# Patient Record
Sex: Male | Born: 1935
Health system: Southern US, Community
[De-identification: ages and names within clinical notes are randomized; demographics above are authoritative.]

## PROBLEM LIST (undated history)

## (undated) DIAGNOSIS — H9190 Unspecified hearing loss, unspecified ear: Secondary | ICD-10-CM

## (undated) DIAGNOSIS — H3530 Unspecified macular degeneration: Secondary | ICD-10-CM

## (undated) DIAGNOSIS — H8109 Meniere's disease, unspecified ear: Secondary | ICD-10-CM

## (undated) DIAGNOSIS — J8410 Pulmonary fibrosis, unspecified: Secondary | ICD-10-CM

## (undated) DIAGNOSIS — J439 Emphysema, unspecified: Secondary | ICD-10-CM

## (undated) DIAGNOSIS — E039 Hypothyroidism, unspecified: Secondary | ICD-10-CM

## (undated) HISTORY — DX: Hypothyroidism, unspecified: E03.9

## (undated) HISTORY — DX: Emphysema, unspecified: J43.9

## (undated) HISTORY — DX: Unspecified macular degeneration: H35.30

## (undated) HISTORY — DX: Unspecified hearing loss, unspecified ear: H91.90

## (undated) HISTORY — DX: Pulmonary fibrosis, unspecified: J84.10

## (undated) HISTORY — PX: CATARACT EXTRACTION, BILATERAL: SHX1313

## (undated) HISTORY — PX: LAMINECTOMY: SHX219

## (undated) HISTORY — PX: TONSILLECTOMY: SUR1361

## (undated) HISTORY — DX: Meniere's disease, unspecified ear: H81.09

---
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
## 1976-12-03 HISTORY — PX: HERNIA REPAIR: SHX51

## 2003-12-04 HISTORY — PX: PROSTATECTOMY: SHX69

## 2003-12-04 HISTORY — PX: BACK SURGERY: SHX140

## 2006-12-03 DIAGNOSIS — C61 Malignant neoplasm of prostate: Secondary | ICD-10-CM

## 2006-12-03 HISTORY — DX: Malignant neoplasm of prostate: C61

## 2014-02-15 LAB — HM COLONOSCOPY

## 2014-05-17 DIAGNOSIS — R42 Dizziness and giddiness: Secondary | ICD-10-CM | POA: Insufficient documentation

## 2014-05-17 DIAGNOSIS — H8109 Meniere's disease, unspecified ear: Secondary | ICD-10-CM | POA: Insufficient documentation

## 2014-12-16 DIAGNOSIS — H3532 Exudative age-related macular degeneration: Secondary | ICD-10-CM | POA: Diagnosis not present

## 2015-02-03 DIAGNOSIS — H3532 Exudative age-related macular degeneration: Secondary | ICD-10-CM | POA: Diagnosis not present

## 2015-03-08 DIAGNOSIS — J309 Allergic rhinitis, unspecified: Secondary | ICD-10-CM | POA: Diagnosis not present

## 2015-03-08 DIAGNOSIS — R05 Cough: Secondary | ICD-10-CM | POA: Diagnosis not present

## 2015-03-08 DIAGNOSIS — J209 Acute bronchitis, unspecified: Secondary | ICD-10-CM | POA: Diagnosis not present

## 2015-03-08 DIAGNOSIS — J9811 Atelectasis: Secondary | ICD-10-CM | POA: Diagnosis not present

## 2015-03-17 DIAGNOSIS — H3532 Exudative age-related macular degeneration: Secondary | ICD-10-CM | POA: Diagnosis not present

## 2015-03-22 DIAGNOSIS — Z0000 Encounter for general adult medical examination without abnormal findings: Secondary | ICD-10-CM | POA: Diagnosis not present

## 2015-03-22 DIAGNOSIS — Z0001 Encounter for general adult medical examination with abnormal findings: Secondary | ICD-10-CM | POA: Diagnosis not present

## 2015-03-22 DIAGNOSIS — Z1211 Encounter for screening for malignant neoplasm of colon: Secondary | ICD-10-CM | POA: Diagnosis not present

## 2015-03-22 DIAGNOSIS — D485 Neoplasm of uncertain behavior of skin: Secondary | ICD-10-CM | POA: Diagnosis not present

## 2015-04-07 DIAGNOSIS — H3532 Exudative age-related macular degeneration: Secondary | ICD-10-CM | POA: Diagnosis not present

## 2015-04-12 DIAGNOSIS — K921 Melena: Secondary | ICD-10-CM | POA: Diagnosis not present

## 2015-04-14 DIAGNOSIS — L821 Other seborrheic keratosis: Secondary | ICD-10-CM | POA: Diagnosis not present

## 2015-04-14 DIAGNOSIS — D485 Neoplasm of uncertain behavior of skin: Secondary | ICD-10-CM | POA: Diagnosis not present

## 2015-04-14 DIAGNOSIS — L578 Other skin changes due to chronic exposure to nonionizing radiation: Secondary | ICD-10-CM | POA: Diagnosis not present

## 2015-04-25 DIAGNOSIS — K219 Gastro-esophageal reflux disease without esophagitis: Secondary | ICD-10-CM | POA: Diagnosis not present

## 2015-04-25 DIAGNOSIS — K2210 Ulcer of esophagus without bleeding: Secondary | ICD-10-CM | POA: Diagnosis not present

## 2015-04-25 DIAGNOSIS — K921 Melena: Secondary | ICD-10-CM | POA: Diagnosis not present

## 2015-04-25 DIAGNOSIS — J309 Allergic rhinitis, unspecified: Secondary | ICD-10-CM | POA: Diagnosis not present

## 2015-04-25 DIAGNOSIS — Z8546 Personal history of malignant neoplasm of prostate: Secondary | ICD-10-CM | POA: Diagnosis not present

## 2015-04-25 DIAGNOSIS — H8109 Meniere's disease, unspecified ear: Secondary | ICD-10-CM | POA: Diagnosis not present

## 2015-04-25 DIAGNOSIS — K209 Esophagitis, unspecified: Secondary | ICD-10-CM | POA: Diagnosis not present

## 2015-04-25 DIAGNOSIS — Z9049 Acquired absence of other specified parts of digestive tract: Secondary | ICD-10-CM | POA: Diagnosis not present

## 2015-04-25 DIAGNOSIS — K210 Gastro-esophageal reflux disease with esophagitis: Secondary | ICD-10-CM | POA: Diagnosis not present

## 2015-04-25 DIAGNOSIS — K2970 Gastritis, unspecified, without bleeding: Secondary | ICD-10-CM | POA: Diagnosis not present

## 2015-04-25 DIAGNOSIS — K449 Diaphragmatic hernia without obstruction or gangrene: Secondary | ICD-10-CM | POA: Diagnosis not present

## 2015-05-12 DIAGNOSIS — H3532 Exudative age-related macular degeneration: Secondary | ICD-10-CM | POA: Diagnosis not present

## 2015-07-07 DIAGNOSIS — H3532 Exudative age-related macular degeneration: Secondary | ICD-10-CM | POA: Diagnosis not present

## 2015-09-01 DIAGNOSIS — H3532 Exudative age-related macular degeneration: Secondary | ICD-10-CM | POA: Diagnosis not present

## 2015-10-06 DIAGNOSIS — H353221 Exudative age-related macular degeneration, left eye, with active choroidal neovascularization: Secondary | ICD-10-CM | POA: Diagnosis not present

## 2015-11-10 DIAGNOSIS — H353221 Exudative age-related macular degeneration, left eye, with active choroidal neovascularization: Secondary | ICD-10-CM | POA: Diagnosis not present

## 2015-12-15 DIAGNOSIS — H353221 Exudative age-related macular degeneration, left eye, with active choroidal neovascularization: Secondary | ICD-10-CM | POA: Diagnosis not present

## 2016-01-10 DIAGNOSIS — J09X2 Influenza due to identified novel influenza A virus with other respiratory manifestations: Secondary | ICD-10-CM | POA: Diagnosis not present

## 2016-01-18 DIAGNOSIS — H6123 Impacted cerumen, bilateral: Secondary | ICD-10-CM | POA: Diagnosis not present

## 2016-02-02 DIAGNOSIS — H353232 Exudative age-related macular degeneration, bilateral, with inactive choroidal neovascularization: Secondary | ICD-10-CM | POA: Diagnosis not present

## 2016-02-28 DIAGNOSIS — I7789 Other specified disorders of arteries and arterioles: Secondary | ICD-10-CM | POA: Diagnosis not present

## 2016-02-28 DIAGNOSIS — I38 Endocarditis, valve unspecified: Secondary | ICD-10-CM | POA: Diagnosis not present

## 2016-02-28 DIAGNOSIS — R0781 Pleurodynia: Secondary | ICD-10-CM | POA: Diagnosis not present

## 2016-02-28 DIAGNOSIS — S299XXA Unspecified injury of thorax, initial encounter: Secondary | ICD-10-CM | POA: Diagnosis not present

## 2016-02-28 DIAGNOSIS — I289 Disease of pulmonary vessels, unspecified: Secondary | ICD-10-CM | POA: Diagnosis not present

## 2016-02-28 DIAGNOSIS — Z9181 History of falling: Secondary | ICD-10-CM | POA: Diagnosis not present

## 2016-02-28 DIAGNOSIS — R0782 Intercostal pain: Secondary | ICD-10-CM | POA: Diagnosis not present

## 2016-02-28 DIAGNOSIS — J8410 Pulmonary fibrosis, unspecified: Secondary | ICD-10-CM | POA: Diagnosis not present

## 2016-04-26 DIAGNOSIS — H353232 Exudative age-related macular degeneration, bilateral, with inactive choroidal neovascularization: Secondary | ICD-10-CM | POA: Diagnosis not present

## 2016-06-07 DIAGNOSIS — H353232 Exudative age-related macular degeneration, bilateral, with inactive choroidal neovascularization: Secondary | ICD-10-CM | POA: Diagnosis not present

## 2016-08-03 HISTORY — PX: CHOLECYSTECTOMY: SHX55

## 2016-08-04 DIAGNOSIS — R109 Unspecified abdominal pain: Secondary | ICD-10-CM | POA: Diagnosis not present

## 2016-08-04 DIAGNOSIS — R918 Other nonspecific abnormal finding of lung field: Secondary | ICD-10-CM | POA: Diagnosis not present

## 2016-08-04 DIAGNOSIS — R1013 Epigastric pain: Secondary | ICD-10-CM | POA: Diagnosis not present

## 2016-08-05 DIAGNOSIS — R1013 Epigastric pain: Secondary | ICD-10-CM | POA: Diagnosis not present

## 2016-08-07 DIAGNOSIS — K8012 Calculus of gallbladder with acute and chronic cholecystitis without obstruction: Secondary | ICD-10-CM | POA: Diagnosis not present

## 2016-08-07 DIAGNOSIS — R10811 Right upper quadrant abdominal tenderness: Secondary | ICD-10-CM | POA: Diagnosis not present

## 2016-08-07 DIAGNOSIS — K810 Acute cholecystitis: Secondary | ICD-10-CM | POA: Diagnosis not present

## 2016-08-07 DIAGNOSIS — K8020 Calculus of gallbladder without cholecystitis without obstruction: Secondary | ICD-10-CM | POA: Diagnosis not present

## 2016-08-07 DIAGNOSIS — R740 Nonspecific elevation of levels of transaminase and lactic acid dehydrogenase [LDH]: Secondary | ICD-10-CM | POA: Diagnosis not present

## 2016-08-07 DIAGNOSIS — J849 Interstitial pulmonary disease, unspecified: Secondary | ICD-10-CM | POA: Diagnosis not present

## 2016-08-07 DIAGNOSIS — R1013 Epigastric pain: Secondary | ICD-10-CM | POA: Diagnosis not present

## 2016-08-07 DIAGNOSIS — I10 Essential (primary) hypertension: Secondary | ICD-10-CM | POA: Diagnosis not present

## 2016-08-07 DIAGNOSIS — K8080 Other cholelithiasis without obstruction: Secondary | ICD-10-CM | POA: Diagnosis not present

## 2016-08-07 DIAGNOSIS — K660 Peritoneal adhesions (postprocedural) (postinfection): Secondary | ICD-10-CM | POA: Diagnosis not present

## 2016-08-07 DIAGNOSIS — R110 Nausea: Secondary | ICD-10-CM | POA: Diagnosis not present

## 2016-08-07 DIAGNOSIS — K828 Other specified diseases of gallbladder: Secondary | ICD-10-CM | POA: Diagnosis not present

## 2016-08-07 DIAGNOSIS — R0602 Shortness of breath: Secondary | ICD-10-CM | POA: Diagnosis not present

## 2016-08-08 DIAGNOSIS — I10 Essential (primary) hypertension: Secondary | ICD-10-CM | POA: Diagnosis not present

## 2016-08-08 DIAGNOSIS — K8010 Calculus of gallbladder with chronic cholecystitis without obstruction: Secondary | ICD-10-CM | POA: Diagnosis not present

## 2016-08-08 DIAGNOSIS — R079 Chest pain, unspecified: Secondary | ICD-10-CM | POA: Diagnosis not present

## 2016-08-10 DIAGNOSIS — K810 Acute cholecystitis: Secondary | ICD-10-CM | POA: Diagnosis not present

## 2016-08-16 DIAGNOSIS — H9190 Unspecified hearing loss, unspecified ear: Secondary | ICD-10-CM | POA: Insufficient documentation

## 2016-08-16 DIAGNOSIS — C61 Malignant neoplasm of prostate: Secondary | ICD-10-CM | POA: Insufficient documentation

## 2016-08-17 DIAGNOSIS — Z09 Encounter for follow-up examination after completed treatment for conditions other than malignant neoplasm: Secondary | ICD-10-CM | POA: Insufficient documentation

## 2016-09-11 DIAGNOSIS — Z23 Encounter for immunization: Secondary | ICD-10-CM | POA: Diagnosis not present

## 2016-09-13 DIAGNOSIS — H353232 Exudative age-related macular degeneration, bilateral, with inactive choroidal neovascularization: Secondary | ICD-10-CM | POA: Diagnosis not present

## 2016-12-27 DIAGNOSIS — H353232 Exudative age-related macular degeneration, bilateral, with inactive choroidal neovascularization: Secondary | ICD-10-CM | POA: Diagnosis not present

## 2016-12-27 DIAGNOSIS — H5203 Hypermetropia, bilateral: Secondary | ICD-10-CM | POA: Diagnosis not present

## 2016-12-27 DIAGNOSIS — H18413 Arcus senilis, bilateral: Secondary | ICD-10-CM | POA: Diagnosis not present

## 2016-12-27 DIAGNOSIS — H43393 Other vitreous opacities, bilateral: Secondary | ICD-10-CM | POA: Diagnosis not present

## 2017-03-06 DIAGNOSIS — J410 Simple chronic bronchitis: Secondary | ICD-10-CM | POA: Diagnosis not present

## 2017-03-06 DIAGNOSIS — R0602 Shortness of breath: Secondary | ICD-10-CM | POA: Diagnosis not present

## 2017-03-06 DIAGNOSIS — H6123 Impacted cerumen, bilateral: Secondary | ICD-10-CM | POA: Diagnosis not present

## 2017-03-14 DIAGNOSIS — E669 Obesity, unspecified: Secondary | ICD-10-CM | POA: Diagnosis not present

## 2017-03-14 DIAGNOSIS — Z9989 Dependence on other enabling machines and devices: Secondary | ICD-10-CM | POA: Diagnosis not present

## 2017-03-14 DIAGNOSIS — H9113 Presbycusis, bilateral: Secondary | ICD-10-CM | POA: Diagnosis not present

## 2017-03-14 DIAGNOSIS — R0600 Dyspnea, unspecified: Secondary | ICD-10-CM | POA: Diagnosis not present

## 2017-03-14 DIAGNOSIS — M542 Cervicalgia: Secondary | ICD-10-CM | POA: Diagnosis not present

## 2017-03-14 DIAGNOSIS — K649 Unspecified hemorrhoids: Secondary | ICD-10-CM | POA: Diagnosis not present

## 2017-03-14 DIAGNOSIS — Z6832 Body mass index (BMI) 32.0-32.9, adult: Secondary | ICD-10-CM | POA: Diagnosis not present

## 2017-03-14 DIAGNOSIS — K08109 Complete loss of teeth, unspecified cause, unspecified class: Secondary | ICD-10-CM | POA: Diagnosis not present

## 2017-03-14 DIAGNOSIS — Z7951 Long term (current) use of inhaled steroids: Secondary | ICD-10-CM | POA: Diagnosis not present

## 2017-03-14 DIAGNOSIS — R2681 Unsteadiness on feet: Secondary | ICD-10-CM | POA: Diagnosis not present

## 2017-03-14 DIAGNOSIS — Z0000 Encounter for general adult medical examination without abnormal findings: Secondary | ICD-10-CM | POA: Diagnosis not present

## 2017-03-14 DIAGNOSIS — Z974 Presence of external hearing-aid: Secondary | ICD-10-CM | POA: Diagnosis not present

## 2017-03-20 DIAGNOSIS — J8410 Pulmonary fibrosis, unspecified: Secondary | ICD-10-CM | POA: Diagnosis not present

## 2017-03-20 DIAGNOSIS — H6123 Impacted cerumen, bilateral: Secondary | ICD-10-CM | POA: Diagnosis not present

## 2017-03-20 DIAGNOSIS — R0602 Shortness of breath: Secondary | ICD-10-CM | POA: Diagnosis not present

## 2017-03-20 DIAGNOSIS — J410 Simple chronic bronchitis: Secondary | ICD-10-CM | POA: Diagnosis not present

## 2017-03-27 ENCOUNTER — Telehealth: Payer: Self-pay | Admitting: Pulmonary Disease

## 2017-03-27 NOTE — Telephone Encounter (Signed)
Spoke with patient and asked that he bring his medications and medical history to his appointment tomorrow with VS at 10 am. Pt stated he does not have any medication, but will try and bring records. Pt was asked to be here at 945 to fill out paperwork. He verbalized understanding. Nothing further is needed.

## 2017-03-28 ENCOUNTER — Other Ambulatory Visit (INDEPENDENT_AMBULATORY_CARE_PROVIDER_SITE_OTHER): Payer: Medicare HMO

## 2017-03-28 ENCOUNTER — Encounter: Payer: Self-pay | Admitting: Pulmonary Disease

## 2017-03-28 ENCOUNTER — Ambulatory Visit (INDEPENDENT_AMBULATORY_CARE_PROVIDER_SITE_OTHER): Payer: Medicare HMO | Admitting: Pulmonary Disease

## 2017-03-28 VITALS — BP 112/78 | HR 63 | Ht 65.0 in | Wt 193.4 lb

## 2017-03-28 DIAGNOSIS — J8410 Pulmonary fibrosis, unspecified: Secondary | ICD-10-CM

## 2017-03-28 LAB — COMPREHENSIVE METABOLIC PANEL
ALBUMIN: 4.3 g/dL (ref 3.5–5.2)
ALK PHOS: 91 U/L (ref 39–117)
ALT: 10 U/L (ref 0–53)
AST: 15 U/L (ref 0–37)
BUN: 20 mg/dL (ref 6–23)
CALCIUM: 9.3 mg/dL (ref 8.4–10.5)
CO2: 31 mEq/L (ref 19–32)
CREATININE: 1.25 mg/dL (ref 0.40–1.50)
Chloride: 103 mEq/L (ref 96–112)
GFR: 58.23 mL/min — ABNORMAL LOW (ref >60.00)
Glucose, Bld: 90 mg/dL (ref 70–99)
POTASSIUM: 4.4 meq/L (ref 3.5–5.1)
SODIUM: 139 meq/L (ref 135–145)
TOTAL PROTEIN: 7.5 g/dL (ref 6.0–8.3)
Total Bilirubin: 0.5 mg/dL (ref 0.2–1.2)

## 2017-03-28 LAB — CBC WITH DIFFERENTIAL/PLATELET
Basophils Absolute: 0.1 10*3/uL (ref 0.0–0.1)
Basophils Relative: 1.1 % (ref 0.0–3.0)
EOS ABS: 0.2 10*3/uL (ref 0.0–0.7)
Eosinophils Relative: 2.3 % (ref 0.0–5.0)
HEMATOCRIT: 46.4 % (ref 39.0–52.0)
HEMOGLOBIN: 15.8 g/dL (ref 13.0–17.0)
LYMPHS PCT: 21.3 % (ref 12.0–46.0)
Lymphs Abs: 1.7 10*3/uL (ref 0.7–4.0)
MCHC: 34 g/dL (ref 30.0–36.0)
MCV: 89 fl (ref 78.0–100.0)
Monocytes Absolute: 0.8 10*3/uL (ref 0.1–1.0)
Monocytes Relative: 10.1 % (ref 3.0–12.0)
Neutro Abs: 5.1 10*3/uL (ref 1.4–7.7)
Neutrophils Relative %: 65.2 % (ref 43.0–77.0)
Platelets: 229 10*3/uL (ref 150.0–400.0)
RBC: 5.22 Mil/uL (ref 4.22–5.81)
RDW: 15 % (ref 11.5–15.5)
WBC: 7.8 10*3/uL (ref 4.0–10.5)

## 2017-03-28 LAB — SEDIMENTATION RATE: SED RATE: 18 mm/h (ref 0–20)

## 2017-03-28 NOTE — Progress Notes (Signed)
   Subjective:    Patient ID: Dalton Flores, male    DOB: 1929/06/15, 83 y.o.   MRN: [id number]  HPI    Review of Systems  Constitutional: Negative for fever and unexpected weight change.  HENT: Positive for sneezing. Negative for congestion, dental problem, ear pain, nosebleeds, postnasal drip, rhinorrhea, sinus pressure, sore throat and trouble swallowing.   Eyes: Negative for redness and itching.  Respiratory: Positive for shortness of breath. Negative for cough, chest tightness and wheezing.   Cardiovascular: Positive for chest pain. Negative for palpitations and leg swelling.  Gastrointestinal: Negative for nausea and vomiting.  Genitourinary: Negative for dysuria.  Musculoskeletal: Negative for joint swelling.  Skin: Negative for rash.  Neurological: Negative for headaches.  Hematological: Does not bruise/bleed easily.  Psychiatric/Behavioral: Negative for dysphoric mood. The patient is not nervous/anxious.        Objective:   Physical Exam        Assessment & Plan:

## 2017-03-28 NOTE — Patient Instructions (Signed)
Lab tests today  Will schedule pulmonary function test and overnight oxygen test  Follow up in 4 weeks with Dr. Halford Chessman or Nurse Practitioner

## 2017-03-28 NOTE — Progress Notes (Signed)
Past surgical history He  has a past surgical history that includes Cholecystectomy (08/2016); Back surgery (2005); Prostatectomy (2005); Appendectomy (1946); and Hernia repair (1978).  Family history His family history includes Brain cancer in his sister; Emphysema in his father; Heart disease in his mother.  Social history He  reports that he has never smoked. He has never used smokeless tobacco. He reports that he does not drink alcohol or use drugs.  No Known Allergies    Review of Systems  Constitutional: Negative for fever and unexpected weight change.  HENT: Positive for sneezing. Negative for congestion, dental problem, ear pain, nosebleeds, postnasal drip, rhinorrhea, sinus pressure, sore throat and trouble swallowing.   Eyes: Negative for redness and itching.  Respiratory: Positive for shortness of breath. Negative for cough, chest tightness and wheezing.   Cardiovascular: Positive for chest pain. Negative for palpitations and leg swelling.  Gastrointestinal: Negative for nausea and vomiting.  Genitourinary: Negative for dysuria.  Musculoskeletal: Negative for joint swelling.  Skin: Negative for rash.  Neurological: Negative for headaches.  Hematological: Does not bruise/bleed easily.  Psychiatric/Behavioral: Negative for dysphoric mood. The patient is not nervous/anxious.     No current outpatient prescriptions on file prior to visit.   No current facility-administered medications on file prior to visit.     Chief Complaint  Patient presents with  . PULMONARY CONSULT    Referred by Dr Dirk Dress for Pulmonary Fibrosis. Recent CXRs about 1 week ago in [place]. Pt also notes having a CT or PET scan.     Pulmonary tests CT angio chest 03/20/17 >> b/l peripheral fibrotic changes  Past medical history He  has a past medical history of Emphysema lung ([place]).  Vital signs BP 112/78 (BP Location: Left Arm, Cuff Size: Normal)   Pulse 63   Ht 5\' 5"  (1.651 m)   Wt 193  lb 6.4 oz (87.7 kg)   SpO2 95%   BMI 32.18 kg/m   History of present illness Dalton Flores is a 83 y.o. male with abnormal CT chest.  He has noticed progressive shortness of breath with exertion.  This seems to have been going on for a least the past one year.  He also gets episodes of cough, and sometimes brings up clear sputum.  He denies chest pain, fever, hemoptysis, or wheezing.  He never smoked cigarettes.  He worked on a farm, and had his own Holiday representative.  He can walk to his mailbox, but then gets short of breath on the way back.  He denies history of asthma, allergies, connective tissue disease, or thromboembolic disease.  His mother had lupus.  He denies skin rash, joint swelling, or leg swelling.  He gets occasional episodes of reflux, but not on regular basis.  He was given an inhaler (?breo).  He thinks might have helped some when he had sample, but not enough that he would want to continue inhaler.  Ambulatory oxymetry on room air day >> low SpO2 93%.   Physical exam  General - No distress Eyes - pupils reactive ENT - No sinus tenderness, no oral exudate, no LAN, no thyromegaly, TM clear, pupils equal/reactive Cardiac - s1s2 regular, no murmur, pulses symmetric Chest - faint basilar crackles b/l Back - No focal tenderness Abd - Soft, non-tender, no organomegaly, + bowel sounds Ext - No edema Neuro - Normal strength, cranial nerves intact Skin - No rashes Psych - Normal mood, and behavior   Discussion He has progressive dyspnea on exertion,  and cough.  His CT chest shows pulmonary fibrosis with an IPF pattern.  Assessment/plan  Pulmonary fibrosis. - will arrange for lab test including serology - will arrange for pulmonary function test - will arrange for overnight oxygen test - he might need esophagram to assess for reflux - further interventions to be determined based on above test results   Patient Instructions  Lab tests today  Will  schedule pulmonary function test and overnight oxygen test  Follow up in 4 weeks with Dr. Halford Chessman or Nurse Practitioner    Chesley Mires, MD [facility] Pager:  [phone] 03/28/2017, 10:35 AM

## 2017-03-29 LAB — SJOGRENS SYNDROME-B EXTRACTABLE NUCLEAR ANTIBODY: SSB (LA) (ENA) ANTIBODY, IGG: NEGATIVE

## 2017-03-29 LAB — ANTI-SCLERODERMA ANTIBODY: Scleroderma (Scl-70) (ENA) Antibody, IgG: <1

## 2017-03-29 LAB — CYCLIC CITRUL PEPTIDE ANTIBODY, IGG: Cyclic Citrullin Peptide Ab: <16 Units

## 2017-03-29 LAB — JO-1 ANTIBODY-IGG: JO-1 ANTIBODY, IGG: NEGATIVE

## 2017-03-29 LAB — RHEUMATOID FACTOR: Rhuematoid fact SerPl-aCnc: <14 IU/mL (ref <14)

## 2017-03-29 LAB — ANA W/REFLEX IF POSITIVE: ANA: NEGATIVE

## 2017-03-29 LAB — SJOGRENS SYNDROME-A EXTRACTABLE NUCLEAR ANTIBODY: SSA (Ro) (ENA) Antibody, IgG: <1

## 2017-04-01 LAB — ANCA SCREEN W REFLEX TITER

## 2017-04-02 DIAGNOSIS — J8410 Pulmonary fibrosis, unspecified: Secondary | ICD-10-CM | POA: Diagnosis not present

## 2017-04-03 ENCOUNTER — Encounter: Payer: Self-pay | Admitting: Pulmonary Disease

## 2017-04-12 ENCOUNTER — Telehealth: Payer: Self-pay | Admitting: Pulmonary Disease

## 2017-04-12 NOTE — Telephone Encounter (Signed)
CMP Latest Ref Rng & Units 03/28/2017  Glucose 70 - 99 mg/dL 90  BUN 6 - 23 mg/dL 20  Creatinine 0.40 - 1.50 mg/dL 1.25  Sodium 135 - 145 mEq/L 139  Potassium 3.5 - 5.1 mEq/L 4.4  Chloride 96 - 112 mEq/L 103  CO2 19 - 32 mEq/L 31  Calcium 8.4 - 10.5 mg/dL 9.3  Total Protein 6.0 - 8.3 g/dL 7.5  Total Bilirubin 0.2 - 1.2 mg/dL 0.5  Alkaline Phos 39 - 117 U/L 91  AST 0 - 37 U/L 15  ALT 0 - 53 U/L 10    CBC Latest Ref Rng & Units 03/28/2017  WBC 4.0 - 10.5 K/uL 7.8  Hemoglobin 13.0 - 17.0 g/dL 15.8  Hematocrit 39.0 - 52.0 % 46.4  Platelets 150.0 - 400.0 K/uL 229.0    Lab Results  Component Value Date   ESRSEDRATE 18 03/28/2017    Serology 03/28/17 >> ANCA, ANA, SCL 70, SSA/SSB, Anti Jo, Anti CCP, RF negative   Will have my nurse inform pt that lab tests were normal.

## 2017-04-12 NOTE — Telephone Encounter (Signed)
Results have been explained to patient, pt expressed understanding.  Pt was schedule for PFT at [facility], this has been rescheduled to our office d/t cost of hospital PFTs.  Nothing further needed.

## 2017-04-25 ENCOUNTER — Ambulatory Visit (INDEPENDENT_AMBULATORY_CARE_PROVIDER_SITE_OTHER): Payer: Medicare HMO | Admitting: Pulmonary Disease

## 2017-04-25 ENCOUNTER — Encounter: Payer: Self-pay | Admitting: Acute Care

## 2017-04-25 ENCOUNTER — Encounter (HOSPITAL_COMMUNITY): Payer: Medicare HMO

## 2017-04-25 ENCOUNTER — Ambulatory Visit (INDEPENDENT_AMBULATORY_CARE_PROVIDER_SITE_OTHER): Payer: Medicare HMO | Admitting: Acute Care

## 2017-04-25 VITALS — BP 112/70 | HR 72 | Ht 62.0 in | Wt 195.6 lb

## 2017-04-25 DIAGNOSIS — J84112 Idiopathic pulmonary fibrosis: Secondary | ICD-10-CM

## 2017-04-25 DIAGNOSIS — K219 Gastro-esophageal reflux disease without esophagitis: Secondary | ICD-10-CM | POA: Diagnosis not present

## 2017-04-25 DIAGNOSIS — J8410 Pulmonary fibrosis, unspecified: Secondary | ICD-10-CM | POA: Diagnosis not present

## 2017-04-25 DIAGNOSIS — G4734 Idiopathic sleep related nonobstructive alveolar hypoventilation: Secondary | ICD-10-CM

## 2017-04-25 LAB — PULMONARY FUNCTION TEST
DL/VA % pred: 94 %
DL/VA: 3.71 ml/min/mmHg/L
DLCO UNC: 14.85 ml/min/mmHg
DLCO cor % pred: 66 %
DLCO cor: 14.35 ml/min/mmHg
DLCO unc % pred: 68 %
FEF 25-75 Post: 1.73 L/sec
FEF 25-75 Pre: 1.74 L/sec
FEF2575-%Change-Post: 0 %
FEF2575-%Pred-Post: 176 %
FEF2575-%Pred-Pre: 177 %
FEV1-%CHANGE-POST: 1 %
FEV1-%PRED-PRE: 112 %
FEV1-%Pred-Post: 114 %
FEV1-POST: 1.9 L
FEV1-Pre: 1.87 L
FEV1FVC-%Change-Post: -2 %
FEV1FVC-%PRED-PRE: 114 %
FEV6-%Change-Post: 4 %
FEV6-%PRED-POST: 108 %
FEV6-%PRED-PRE: 103 %
FEV6-POST: 2.44 L
FEV6-PRE: 2.33 L
FEV6FVC-%CHANGE-POST: 0 %
FEV6FVC-%PRED-POST: 111 %
FEV6FVC-%Pred-Pre: 111 %
FVC-%CHANGE-POST: 4 %
FVC-%PRED-POST: 97 %
FVC-%Pred-Pre: 93 %
FVC-Post: 2.44 L
FVC-Pre: 2.34 L
POST FEV6/FVC RATIO: 100 %
PRE FEV6/FVC RATIO: 100 %
Post FEV1/FVC ratio: 78 %
Pre FEV1/FVC ratio: 80 %
RV % PRED: 71 %
RV: 1.68 L
TLC % PRED: 76 %
TLC: 4.2 L

## 2017-04-25 MED ORDER — PANTOPRAZOLE SODIUM 40 MG PO TBEC: 40.0000 mg | DELAYED_RELEASE_TABLET | Freq: Every day | ORAL | 1 refills | Status: DC

## 2017-04-25 MED ORDER — BUDESONIDE-FORMOTEROL FUMARATE 160-4.5 MCG/ACT IN AERO: 2.0000 | INHALATION_SPRAY | Freq: Two times a day (BID) | RESPIRATORY_TRACT | 0 refills | Status: DC

## 2017-04-25 NOTE — Progress Notes (Signed)
PFT done today. 

## 2017-04-25 NOTE — Progress Notes (Signed)
Will reassess his status at next ROV after adding symbicort and supplemental oxygen.  Will then decide whether to pursue antifibrinolytic therapy with either Esbriet or Ofev.  Dalton Mires, MD [facility] Pulmonary/Critical Care 04/25/2017, 4:50 PM Pager:  [phone] After 3pm call: [phone]

## 2017-04-25 NOTE — Patient Instructions (Addendum)
It is nice to meet you today. Your pulmonary function tests show a defect in diffusion of oxygen. This is a finding consistent with pulmonary fibrosis Think about if you are interested in taking one of the medications available for pulmonary fibrosis. We will do a therapeutic trial of Symbicort 2 puffs  Twice daily. Rinse mouth after use. Call us if the Symbicort helps your shortness of breath, so we can send in a prescription. We will order nocturnal oxygen today. Please wear oxygen at 2 L [place] with sleep.  Start Protonix 40 mg once daily at bedtime. I will give you a copy of the reflux diet. Follow up with Dr. Halford Chessman at first available ( 08/21/2017) Please contact office for sooner follow up if symptoms do not improve or worsen or seek emergency care  Schedule esophogram and return in 6 weeks to see Dr. Halford Chessman or NP.

## 2017-04-25 NOTE — Assessment & Plan Note (Addendum)
Continued dyspnea on exertion Plan Your pulmonary function tests show a defect in diffusion of oxygen. This is a finding consistent with pulmonary fibrosis Think about if you are interested in taking one of the medications available for pulmonary fibrosis. These medications have been found to slow progression of disease in some patients. We will do a therapeutic trial of Symbicort 2 puffs  Twice daily. Rinse mouth after use. Call us if the Symbicort helps your shortness of breath, so we can send in a prescription. We will order nocturnal oxygen today. Please wear oxygen at 2 L [place] with sleep.  Start Protonix 40 mg once daily at bedtime. I will give you a copy of the reflux diet. Follow up with Dr. Halford Chessman at first available ( 08/21/2017) Please contact office for sooner follow up if symptoms do not improve or worsen or seek emergency care  Schedule esophogram and return in 6 weeks to see Dr. Halford Chessman or NP.

## 2017-04-25 NOTE — Assessment & Plan Note (Signed)
Per O&O done 04/02/2017 oxygen saturations are 88% or below for at least 5 minutes Plan: Order nocturnal oxygen Wear oxygen at 2 L nasal cannula at every bedtime Follow-up in 4-6 weeks with NP for evaluation of treatment

## 2017-04-25 NOTE — Progress Notes (Signed)
History of Present Illness Dalton Flores is a 83 y.o. male with abnormal chest CT, suspicious for pulmonary fibrosis with an IPF pattern He was seen for consult by Dalton Flores.   04/25/2017 Follow up : Pt was seen by Dalton Flores 03/28/2017 as referral by PCP ( Dalton Flores) for abnormal CT angio of chest. CT was positive for bilateral peripheral fibrotic changes. Patient had noticed progressive shortness of breath with exertion over the last year. He is a never cigarette smoker. He worked on a farm, and had a witnessed Scientist, research (medical) business. His PCP gave him an inhaler, which we suspect is BREO, which she stated may have helped , but he did not feel a significant response. He does not desaturate below 93% with ambulatory oximetry. Suspicion was high for fibrosis with IPF pattern. Evaluation at initial consult included serology, Pulmicort function testing, an overnight oximetry. Plan was to consider esophagram to evaluate for reflux. Patient states he does have reflux, but is currently not on medication to treat it. Patient presents today with daughter for follow-up. Pulmonary function tests done 04/25/2017 indicate no restriction or obstruction, however diminished diffusion capacity, consistent with pulmonary fibrosis. Overnight oximetry indicates arterial oxygen desaturations below 88% for at least 5 minutes.Serology 03/28/17 >> ANCA, ANA, SCL 70, SSA/SSB, Anti Jo, Anti CCP, RF negative. All results were reviewed and discussed with the patient. The patient states he continues to have dyspnea with exertion. He is asking that we do another therapeutic trial of an ICS/ LABA to see if he gets benefit. He is requesting a twice daily inhaler. Patient denies fever, chest pain, orthopnea, or hemoptysis. There is no imaging within Epic for me to review today. Per patient's all imaging was done at [facility]. I did initiate conversation today regarding the use of some of the pulmonary fibrosis agents available  for treatment. I did explain that they come with some side effects. I have asked Dalton Flores in his daughter to think about whether they would like to initiate such a therapy .  Test Results: Ambulatory oxymetry on room air 03/28/2017 >> low SpO2 93%. CT angio chest 03/20/17 >> b/l peripheral fibrotic changes Serology 03/28/17 >> ANCA, ANA, SCL 70, SSA/SSB, Anti Jo, Anti CCP, RF negative  CBC Latest Ref Rng & Units 03/28/2017  WBC 4.0 - 10.5 K/uL 7.8  Hemoglobin 13.0 - 17.0 g/dL 15.8  Hematocrit 39.0 - 52.0 % 46.4  Platelets 150.0 - 400.0 K/uL 229.0    BMP Latest Ref Rng & Units 03/28/2017  Glucose 70 - 99 mg/dL 90  BUN 6 - 23 mg/dL 20  Creatinine 0.40 - 1.50 mg/dL 1.25  Sodium 135 - 145 mEq/L 139  Potassium 3.5 - 5.1 mEq/L 4.4  Chloride 96 - 112 mEq/L 103  CO2 19 - 32 mEq/L 31  Calcium 8.4 - 10.5 mg/dL 9.3   PFT    Component Value Date/Time   FEV1PRE 1.87 04/25/2017 0942   FEV1POST 1.90 04/25/2017 0942   FVCPRE 2.34 04/25/2017 0942   FVCPOST 2.44 04/25/2017 0942   TLC 4.20 04/25/2017 0942   DLCOUNC 14.85 04/25/2017 0942   PREFEV1FVCRT 80 04/25/2017 0942   PSTFEV1FVCRT 78 04/25/2017 0942    No results found.   Past medical hx Past Medical History:  Diagnosis Date  . Emphysema lung ([place])      Social History  Substance Use Topics  . Smoking status: Never Smoker  . Smokeless tobacco: Never Used  . Alcohol use No    Tobacco  Cessation: Never smoker  Past surgical hx, Family hx, Social hx all reviewed.  Current Outpatient Prescriptions on File Prior to Visit  Medication Sig  . ibuprofen (ADVIL,MOTRIN) 200 MG tablet Take 200 mg by mouth every 6 (six) hours as needed.  . Multiple Vitamins-Minerals (EYE VITAMINS & MINERALS PO) Take 1 tablet by mouth daily.   No current facility-administered medications on file prior to visit.      No Known Allergies  Review Of Systems:  Constitutional:   No  weight loss, night sweats,  Fevers, chills, fatigue, or   lassitude.  HEENT:   No headaches,  Difficulty swallowing,  Tooth/dental problems, or  Sore throat,                No sneezing, itching, ear ache, nasal congestion, post nasal drip,   CV:  No chest pain,  Orthopnea, PND, swelling in lower extremities, anasarca, dizziness, palpitations, syncope.   GI  No heartburn, indigestion, abdominal pain, nausea, vomiting, diarrhea, change in bowel habits, loss of appetite, bloody stools.   Resp: + shortness of breath with exertion less at rest.  No excess mucus, no productive cough,  No non-productive cough,  No coughing up of blood.  No change in color of mucus.  No wheezing.  No chest wall deformity  Skin: no rash or lesions.  GU: no dysuria, change in color of urine, no urgency or frequency.  No flank pain, no hematuria   MS:  No joint pain or swelling.  No decreased range of motion.  No back pain.  Psych:  No change in mood or affect. No depression or anxiety.  No memory loss.   Vital Signs BP 112/70 (BP Location: Left Arm, Cuff Size: Normal)   Pulse 72   Ht 5\' 2"  (1.575 m)   Wt 195 lb 9.6 oz (88.7 kg)   SpO2 98%   BMI 35.78 kg/m    Physical Exam:  General- No distress,  A&Ox3, pleasant and hard of hearing ENT: No sinus tenderness, TM clear, pale nasal mucosa, no oral exudate,no post nasal drip, no LAN Cardiac: S1, S2, regular rate and rhythm, no murmur Chest: No wheeze/ rales/ dullness; no accessory muscle use, no nasal flaring, no sternal retractions, crackles two thirds of the way up. Abd.: Soft Non-tender, obese Ext: No clubbing cyanosis, trace edema Neuro:  Deconditioned at baseline Skin: No rashes, warm and dry Psych: normal mood and behavior   Assessment/Plan  IPF (idiopathic pulmonary fibrosis) (HCC) Continued dyspnea on exertion Plan Your pulmonary function tests show a defect in diffusion of oxygen. This is a finding consistent with pulmonary fibrosis Think about if you are interested in taking one of the  medications available for pulmonary fibrosis. These medications have been found to slow progression of disease in some patients. We will do a therapeutic trial of Symbicort 2 puffs  Twice daily. Rinse mouth after use. Call us if the Symbicort helps your shortness of breath, so we can send in a prescription. We will order nocturnal oxygen today. Please wear oxygen at 2 L [place] with sleep.  Start Protonix 40 mg once daily at bedtime. I will give you a copy of the reflux diet. Follow up with Dalton Flores at first available ( 08/21/2017) Please contact office for sooner follow up if symptoms do not improve or worsen or seek emergency care  Schedule esophogram and return in 6 weeks to see Dalton Flores or NP.     Nocturnal hypoxemia Per O&O done 04/02/2017 oxygen  saturations are 88% or below for at least 5 minutes Plan: Order nocturnal oxygen Wear oxygen at 2 L nasal cannula at every bedtime Follow-up in 4-6 weeks with NP for evaluation of treatment    Magdalen Spatz, NP 04/25/2017  4:37 PM

## 2017-04-26 ENCOUNTER — Telehealth: Payer: Self-pay | Admitting: Acute Care

## 2017-04-26 DIAGNOSIS — J8410 Pulmonary fibrosis, unspecified: Secondary | ICD-10-CM | POA: Diagnosis not present

## 2017-04-26 NOTE — Telephone Encounter (Signed)
Please schedule patient to see me in July to see how he is doing since his appointment with Dr. Halford Chessman is not until September.. Thanks

## 2017-04-26 NOTE — Telephone Encounter (Signed)
Called pt and made appt with SG for 06/26/2017 at 2pm.

## 2017-04-30 ENCOUNTER — Encounter: Payer: Self-pay | Admitting: Acute Care

## 2017-04-30 DIAGNOSIS — J8410 Pulmonary fibrosis, unspecified: Secondary | ICD-10-CM | POA: Diagnosis not present

## 2017-04-30 DIAGNOSIS — R12 Heartburn: Secondary | ICD-10-CM | POA: Diagnosis not present

## 2017-04-30 DIAGNOSIS — K224 Dyskinesia of esophagus: Secondary | ICD-10-CM | POA: Diagnosis not present

## 2017-04-30 DIAGNOSIS — K219 Gastro-esophageal reflux disease without esophagitis: Secondary | ICD-10-CM | POA: Diagnosis not present

## 2017-05-06 ENCOUNTER — Telehealth: Payer: Self-pay | Admitting: Acute Care

## 2017-05-06 MED ORDER — BUDESONIDE-FORMOTEROL FUMARATE 160-4.5 MCG/ACT IN AERO: 2.0000 | INHALATION_SPRAY | Freq: Two times a day (BID) | RESPIRATORY_TRACT | 0 refills | Status: DC

## 2017-05-06 MED ORDER — BUDESONIDE-FORMOTEROL FUMARATE 160-4.5 MCG/ACT IN AERO
2.0000 | INHALATION_SPRAY | Freq: Two times a day (BID) | RESPIRATORY_TRACT | 0 refills | Status: DC
Start: 2017-05-06 — End: 2017-08-22

## 2017-05-06 MED ORDER — BUDESONIDE-FORMOTEROL FUMARATE 160-4.5 MCG/ACT IN AERO: 2.0000 | INHALATION_SPRAY | Freq: Two times a day (BID) | RESPIRATORY_TRACT | 2 refills | Status: DC

## 2017-05-06 NOTE — Telephone Encounter (Signed)
Spoke with patient's daughter. Stated that the patient is doing well on Symbicort 160 and would like a RX sent in to [facility]. Rx was sent in. Since the patient will run out Thursday, a sample has been left up front for pick. She is aware and verbalized understanding.

## 2017-05-09 ENCOUNTER — Telehealth: Payer: Self-pay | Admitting: Acute Care

## 2017-05-09 NOTE — Telephone Encounter (Signed)
Spoke with pt's daughter, Jeannene Patella. Advised her that there is a generic to Symbicort. States that she will contact his insurance company for cheaper alternatives. Nothing further was needed at this time.

## 2017-05-10 ENCOUNTER — Telehealth: Payer: Self-pay | Admitting: Pulmonary Disease

## 2017-05-10 NOTE — Telephone Encounter (Signed)
Attempted to contact pt's daughter. No answer, no option to leave a message. Will try back.

## 2017-05-13 NOTE — Telephone Encounter (Signed)
Called and spoke to the pts daughter for a few mins and then the call was disconnceted.  She stated that the symbicort is going to be $221 for a 3 month supply until he meets his deductible.  This is when the phone call was disconnected and I was not able to reach the pts daughter again.  Will need to call back.

## 2017-05-13 NOTE — Telephone Encounter (Signed)
Attempted to contact pt's daughter at work. Received a fast busy signal x2. Will try back.

## 2017-05-13 NOTE — Telephone Encounter (Signed)
Pt daughter returning call and can be reached @ (hm) [phone] will be there til 8:50 otherwise you can reach her @ [phone]).Hillery Hunter

## 2017-05-14 NOTE — Telephone Encounter (Signed)
Spoke with Pam  She is asking that VS provide a letter stating medical necessity for pt's symbicort  Med is costing over $200 for 3 month supply

## 2017-05-15 ENCOUNTER — Telehealth: Payer: Self-pay | Admitting: Pulmonary Disease

## 2017-05-15 NOTE — Telephone Encounter (Signed)
Please let him know that it is okay to stop symbicort since he is only getting mild benefit, and the risk of side effects and expense of medication likely outweigh any benefit he might get (which is minimal given that he does not have obstructive lung disease).

## 2017-05-15 NOTE — Telephone Encounter (Signed)
Please find out first if he notices any improvement in his respiratory symptoms from using symbicort.  If he doesn't feel like his breathing is better from using symbicort, then there is no need to get prescription filled.  If he does feel like symbicort is helping, then I can write a letter.  The issue will be that he had pulmonary fibrosis and not obstructive lung disease >> he was prescribed symbicort at last visit with SG for therapeutic trial.  Insurance might not approve since he doesn't have obstructive lung disease.

## 2017-05-15 NOTE — Telephone Encounter (Signed)
Spoke with Pam, the pt's daughter and notified of recs per VS  She verbalized understanding  Nothing further needed

## 2017-05-15 NOTE — Telephone Encounter (Signed)
Spoke with pt, who states he has noticed mild improvement with symbicort.   Will route to VS to make aware.

## 2017-05-15 NOTE — Telephone Encounter (Signed)
Error.Stanley A Dalton ° °

## 2017-05-21 ENCOUNTER — Telehealth: Payer: Self-pay | Admitting: Acute Care

## 2017-05-21 NOTE — Telephone Encounter (Signed)
Please call patient and let her know that his swallow evaluation was normal. Please let him know that there was notation of gastroesophageal reflux, but no indication of esophagitis. Remind him to continue his Protonix as he has been doing, and to follow the GERD diet. Thank you

## 2017-05-22 ENCOUNTER — Telehealth: Payer: Self-pay | Admitting: Pulmonary Disease

## 2017-05-22 MED ORDER — OMEPRAZOLE 40 MG PO CPDR: 40.0000 mg | DELAYED_RELEASE_CAPSULE | Freq: Every day | ORAL | 1 refills | Status: DC

## 2017-05-22 NOTE — Telephone Encounter (Signed)
Spoke with the pt's spouse and gave his dg esophagus results  She states that he has been having diarrhea since he started on protonix  She is wondering if he can be switched to omeprazole  She takes this and does not have that problem  Please advise thanks!

## 2017-05-22 NOTE — Telephone Encounter (Signed)
Please have him switch the omeprazole if that is what works for him. Please make sure they know to call us if they're having any issues with medications we prescribed. Thanks so much

## 2017-05-22 NOTE — Telephone Encounter (Signed)
Spoke with the pt's spouse and notified of recs per SG  Rx was sent to pharm and I have updated the [facility]  Nothing further needed

## 2017-05-22 NOTE — Telephone Encounter (Signed)
ATC pt, no answer. Left message for pt to call back.  

## 2017-05-27 DIAGNOSIS — J8410 Pulmonary fibrosis, unspecified: Secondary | ICD-10-CM | POA: Diagnosis not present

## 2017-05-30 NOTE — Telephone Encounter (Signed)
See 6/20 phone note

## 2017-06-26 ENCOUNTER — Ambulatory Visit: Payer: Medicare HMO | Admitting: Acute Care

## 2017-06-26 DIAGNOSIS — J8410 Pulmonary fibrosis, unspecified: Secondary | ICD-10-CM | POA: Diagnosis not present

## 2017-07-27 DIAGNOSIS — J8410 Pulmonary fibrosis, unspecified: Secondary | ICD-10-CM | POA: Diagnosis not present

## 2017-08-21 ENCOUNTER — Encounter: Payer: Self-pay | Admitting: Pulmonary Disease

## 2017-08-21 ENCOUNTER — Ambulatory Visit (INDEPENDENT_AMBULATORY_CARE_PROVIDER_SITE_OTHER): Payer: Medicare HMO | Admitting: Pulmonary Disease

## 2017-08-21 VITALS — BP 138/70 | HR 80 | Ht 65.0 in | Wt 193.0 lb

## 2017-08-21 DIAGNOSIS — J84112 Idiopathic pulmonary fibrosis: Secondary | ICD-10-CM | POA: Diagnosis not present

## 2017-08-21 DIAGNOSIS — Z23 Encounter for immunization: Secondary | ICD-10-CM

## 2017-08-21 DIAGNOSIS — M7989 Other specified soft tissue disorders: Secondary | ICD-10-CM | POA: Diagnosis not present

## 2017-08-21 DIAGNOSIS — G4734 Idiopathic sleep related nonobstructive alveolar hypoventilation: Secondary | ICD-10-CM | POA: Diagnosis not present

## 2017-08-21 NOTE — Patient Instructions (Signed)
Will have you walk today to determine if you need to use oxygen during the day  Will arrange for ultrasound of the right leg and echocardiogram - will check whether these can be done in [place]  High dose flu shot today  Follow up in 6 months

## 2017-08-21 NOTE — Progress Notes (Signed)
Current Outpatient Prescriptions on File Prior to Visit  Medication Sig  . ibuprofen (ADVIL,MOTRIN) 200 MG tablet Take 200 mg by mouth every 6 (six) hours as needed.  . Multiple Vitamins-Minerals (EYE VITAMINS & MINERALS PO) Take 1 tablet by mouth daily.  Marland Kitchen omeprazole (PRILOSEC) 40 MG capsule Take 1 capsule (40 mg total) by mouth daily before breakfast.  . budesonide-formoterol (SYMBICORT) 160-4.5 MCG/ACT inhaler Inhale 2 puffs into the lungs 2 (two) times daily. (Patient not taking: Reported on 08/21/2017)  . budesonide-formoterol (SYMBICORT) 160-4.5 MCG/ACT inhaler Inhale 2 puffs into the lungs 2 (two) times daily. (Patient not taking: Reported on 08/21/2017)   No current facility-administered medications on file prior to visit.      Chief Complaint  Patient presents with  . Follow-up    Pt breathing is not getting better over last three months. Pt is SOB at rest and with exertsion.     Pulmonary tests CT angio chest 03/20/17 >> b/l peripheral fibrotic changes Serology 03/28/17 >> ANCA, ANA, SCL 70, SSA/SSB, Anti Jo, Anti CCP, RF negative PFT 04/25/17 >> FEV1 1.90 (114%), FEV1% 78, TLC 4.20 (76%), DLCO 68%, no BD Esophagram 06/12/17 >> Mod GERD, mild/mod esophageal dysmotility  Past medical history, Past surgical history, Family history, Social history, Allergies all reviewed.  Vital Signs BP 138/70 (BP Location: Left Arm, Cuff Size: Normal)   Pulse 80   Ht 5\' 5"  (1.651 m)   Wt 193 lb (87.5 kg)   SpO2 95%   BMI 32.12 kg/m   History of Present Illness Dalton Flores is a 83 y.o. male with dyspnea and pulmonary fibrosis with IPF pattern.  He has been using his oxygen at night.  He still gets winded with activity, but can keep up with routine activities.    He is not having cough, wheeze, sputum, or chest pain.  He has soreness in Rt knee and this has been associated with Rt lower leg swelling.  He denies fever, or rash.  He tried using symbicort, but this didn't help much.   He is not using this anymore.  He maintained his oxygen level with ambulation on room air today.  Physical Exam  General - No distress ENT - No sinus tenderness, no oral exudate, no LAN Cardiac - s1s2 regular, no murmur Chest - faint basilar crackles Back - No focal tenderness Abd - Soft, non-tender Ext - 1+ ankle edema of Rt leg Neuro - Normal strength Skin - No rashes Psych - normal mood, and behavior   Assessment/Plan  ILD with IPF pattern. - continue to monitor clinically - high dose flu shot today  Nocturnal hypoxemia. - continue supplemental oxygen at night  GERD. - could be contributing to ILD - continue prilosec  Persistent dyspnea on exertion. - will get Echo to assess for LV function and pulmonary HTN  Rt lower leg edema. - will arrange for doppler of Rt leg   Patient Instructions  Will have you walk today to determine if you need to use oxygen during the day  Will arrange for ultrasound of the right leg and echocardiogram - will check whether these can be done in [place]  High dose flu shot today  Follow up in 6 months   Chesley Mires, MD [place] [facility]/Sleep Pager:  [phone] 08/21/2017, 4:52 PM

## 2017-08-22 ENCOUNTER — Telehealth: Payer: Self-pay | Admitting: Pulmonary Disease

## 2017-08-22 NOTE — Addendum Note (Signed)
Addended by: Parke Poisson E on: 08/22/2017 04:25 PM   Modules accepted: Orders

## 2017-08-22 NOTE — Telephone Encounter (Signed)
Spoke with Dalton Flores and she stated they did want these procedures done in [place].   The x-ray can be done at [facility]  See if the Echocardiogram can be done at [facility] because family does not know of any cardiology places in [place], do not schedule on the 8/26 or 10/8, requests to have both tests done at the same time. Please call daughter [phone].  PCC's can we help them with this? Thanks.

## 2017-08-22 NOTE — Telephone Encounter (Signed)
I called pt's daughter & told her we would need to get Echo precerted & then we can schedule at [facility].  I do not see an order for an x-ray.  Dtr states he was having problems with his legs.  I looked and VS put in order for Doppler Venous yesterday but I can't find order.  Told pt I would have tests precerted & then we can call and schedule.  I will call her back once done.

## 2017-08-23 NOTE — Telephone Encounter (Signed)
Scheduled pt for echo & vascular ultrasound at [facility] on 9/27 at 8:45 & 9:30.  Called pt's dtr [name] back & gave her appt info.  Faxed orders to [facility].  Nothing further needed.

## 2017-08-28 ENCOUNTER — Other Ambulatory Visit (HOSPITAL_COMMUNITY): Payer: Medicare HMO

## 2017-08-29 ENCOUNTER — Encounter: Payer: Self-pay | Admitting: Pulmonary Disease

## 2017-08-29 DIAGNOSIS — M25561 Pain in right knee: Secondary | ICD-10-CM | POA: Diagnosis not present

## 2017-08-29 DIAGNOSIS — I082 Rheumatic disorders of both aortic and tricuspid valves: Secondary | ICD-10-CM | POA: Diagnosis not present

## 2017-08-29 DIAGNOSIS — R0609 Other forms of dyspnea: Secondary | ICD-10-CM | POA: Diagnosis not present

## 2017-08-29 DIAGNOSIS — M7989 Other specified soft tissue disorders: Secondary | ICD-10-CM | POA: Diagnosis not present

## 2017-08-29 DIAGNOSIS — M79661 Pain in right lower leg: Secondary | ICD-10-CM | POA: Diagnosis not present

## 2017-08-29 DIAGNOSIS — R0602 Shortness of breath: Secondary | ICD-10-CM | POA: Diagnosis not present

## 2017-08-29 DIAGNOSIS — J84112 Idiopathic pulmonary fibrosis: Secondary | ICD-10-CM | POA: Diagnosis not present

## 2017-09-03 ENCOUNTER — Telehealth: Payer: Self-pay | Admitting: Pulmonary Disease

## 2017-09-03 NOTE — Telephone Encounter (Signed)
Doppler Rt leg 08/28/17 >> no DVT Echo 08/29/17 >> EF 55 to 60%, mod TR, enlarged RV, impaired diastolic relaxation   Please let him know his leg ultrasound did not show any clots in his leg.  His Echocardiogram showed enlargement of the right side of his heart, likely related to his known pulmonary fibrosis and low oxygen levels at night.  He should continue to wear his oxygen at night.

## 2017-09-03 NOTE — Telephone Encounter (Signed)
Left message with pt's wife for him to return call, and that I will try to call back later on this afternoon. Will follow up with another call later today for result update.

## 2017-09-03 NOTE — Telephone Encounter (Signed)
Called and spoke with pts wife and she is aware of results per VS.  Nothing further is needed.

## 2017-09-03 NOTE — Telephone Encounter (Signed)
LVM on machine for patient to return phone call regarding results. Will try to call back again at later date.

## 2017-09-03 NOTE — Telephone Encounter (Signed)
Patient's wife is returning phone call.  °

## 2017-09-26 DIAGNOSIS — J8410 Pulmonary fibrosis, unspecified: Secondary | ICD-10-CM | POA: Diagnosis not present

## 2017-10-27 DIAGNOSIS — J8410 Pulmonary fibrosis, unspecified: Secondary | ICD-10-CM | POA: Diagnosis not present

## 2017-11-26 DIAGNOSIS — J8410 Pulmonary fibrosis, unspecified: Secondary | ICD-10-CM | POA: Diagnosis not present

## 2017-12-27 DIAGNOSIS — J8410 Pulmonary fibrosis, unspecified: Secondary | ICD-10-CM | POA: Diagnosis not present

## 2018-01-27 DIAGNOSIS — J8410 Pulmonary fibrosis, unspecified: Secondary | ICD-10-CM | POA: Diagnosis not present

## 2018-02-14 DIAGNOSIS — R5383 Other fatigue: Secondary | ICD-10-CM | POA: Diagnosis not present

## 2018-02-14 DIAGNOSIS — J84112 Idiopathic pulmonary fibrosis: Secondary | ICD-10-CM | POA: Diagnosis not present

## 2018-02-14 DIAGNOSIS — R0602 Shortness of breath: Secondary | ICD-10-CM | POA: Diagnosis not present

## 2018-02-24 DIAGNOSIS — J8410 Pulmonary fibrosis, unspecified: Secondary | ICD-10-CM | POA: Diagnosis not present

## 2018-02-28 ENCOUNTER — Ambulatory Visit (INDEPENDENT_AMBULATORY_CARE_PROVIDER_SITE_OTHER)
Admission: RE | Admit: 2018-02-28 | Discharge: 2018-02-28 | Disposition: A | Discharge status: Home / Self Care | Payer: Medicare HMO | Source: Ambulatory Visit | Attending: Adult Health | Admitting: Adult Health

## 2018-02-28 ENCOUNTER — Encounter: Payer: Self-pay | Admitting: Adult Health

## 2018-02-28 ENCOUNTER — Ambulatory Visit: Payer: Medicare HMO | Admitting: Adult Health

## 2018-02-28 VITALS — BP 116/66 | HR 73 | Ht 65.0 in | Wt 189.6 lb

## 2018-02-28 DIAGNOSIS — J84112 Idiopathic pulmonary fibrosis: Secondary | ICD-10-CM | POA: Diagnosis not present

## 2018-02-28 DIAGNOSIS — R0602 Shortness of breath: Secondary | ICD-10-CM | POA: Diagnosis not present

## 2018-02-28 DIAGNOSIS — G4734 Idiopathic sleep related nonobstructive alveolar hypoventilation: Secondary | ICD-10-CM | POA: Diagnosis not present

## 2018-02-28 NOTE — Progress Notes (Signed)
Called spoke with patient's spouse.  Advised of lab results / recs as stated by TP.  Spouse verbalized understanding and denied any questions.

## 2018-02-28 NOTE — Progress Notes (Signed)
Reviewed and agree with assessment/plan.   Isak Sotomayor, MD  Pulmonary/Critical Care 11/28/2016, 12:24 PM Pager:  [phone]  

## 2018-02-28 NOTE — Progress Notes (Signed)
@Patient  ID: Dalton Flores, male    DOB: 09-May-1929, 84 y.o.   MRN: [id number]  Chief Complaint  Patient presents with  . Follow-up    Pulmonary Fibrosis     Referring provider: Rochel Brome, MD  HPI: 84 year old male never smoker followed for pulmonary fibrosis with IPF pattern on CT chest and chronic respiratory failure on terminal oxygen.  tests CT angio chest 03/20/17 >> b/l peripheral fibrotic changes Serology 03/28/17 >> ANCA, ANA, SCL 70, SSA/SSB, Anti Jo, Anti CCP, RF negative PFT 04/25/17 >> FEV1 1.90 (114%), FEV1% 78, TLC 4.20 (76%), DLCO 68%, no BD Esophagram 06/12/17 >> Mod GERD, mild/mod esophageal dysmotility Doppler Rt leg 08/28/17 >> no DVT Echo 08/29/17 >> EF 55 to 60%, mod TR, enlarged RV, impaired diastolic relaxation   5/85/2778 Follow up : Pulmonary Fibrosis , O2 RF  Patient returns for a six-month follow-up.  Patient says he is doing about the same with his breathing.  He gets winded with heavy activity.  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  He does get frustrated that he runs out of breath at times.  No decreased activity tolerance.  Walk test in the office with no desaturations on room air He is worried that his breathing will continue to decline.  He denies any flare of cough. Last visit had some lower extremity swelling right greater than left.  A venous Doppler was negative for DVT.  Echo showed preserved EF.  Enlarged right ventricle and a moderate tricuspid regurg and impaired diastolic relaxation.  Patient says his lower extremity swelling has improved.  Patient is not on a diuretic He says he was seen by his primary care physician last week and had routine lab work that was reported as normal.  Records unavailable   No Known Allergies  Immunization History  Administered Date(s) Administered  . Influenza, High Dose Seasonal PF 08/21/2017  . Influenza,inj,Quad PF,6+ Mos 09/02/2016    Past Medical History:  Diagnosis Date  . Emphysema lung  (HCC)     Tobacco History: Social History   Tobacco Use  Smoking Status Never Smoker  Smokeless Tobacco Never Used   Counseling given: Not Answered   Outpatient Encounter Medications as of 02/28/2018  Medication Sig  . Multiple Vitamins-Minerals (EYE VITAMINS & MINERALS PO) Take 1 tablet by mouth daily.  Marland Kitchen ibuprofen (ADVIL,MOTRIN) 200 MG tablet Take 200 mg by mouth every 6 (six) hours as needed.  Marland Kitchen omeprazole (PRILOSEC) 40 MG capsule Take 1 capsule (40 mg total) by mouth daily before breakfast. (Patient not taking: Reported on 02/28/2018)   No facility-administered encounter medications on file as of 02/28/2018.      Review of Systems  Constitutional:   No  weight loss, night sweats,  Fevers, chills,  +fatigue, or  lassitude.  HEENT:   No headaches,  Difficulty swallowing,  Tooth/dental problems, or  Sore throat,                No sneezing, itching, ear ache, nasal congestion, post nasal drip,   CV:  No chest pain,  Orthopnea, PND, swelling in lower extremities, anasarca, dizziness, palpitations, syncope.   GI  No heartburn, indigestion, abdominal pain, nausea, vomiting, diarrhea, change in bowel habits, loss of appetite, bloody stools.   Resp: No shortness of breath with exertion or at rest.  No excess mucus, no productive cough,  No non-productive cough,  No coughing up of blood.  No change in color of mucus.  No wheezing.  No chest wall deformity  Skin: no rash or lesions.  GU: no dysuria, change in color of urine, no urgency or frequency.  No flank pain, no hematuria   MS:  No joint pain or swelling.  No decreased range of motion.  No back pain.    Physical Exam  BP 116/66 (BP Location: Left Arm, Cuff Size: Normal)   Pulse 73   Ht 5\' 5"  (1.651 m)   Wt 189 lb 9.6 oz (86 kg)   SpO2 98%   BMI 31.55 kg/m   GEN: A/Ox3; pleasant , NAD, well nourished    HEENT:  [place]/AT,  EACs-clear, TMs-wnl, NOSE-clear, THROAT-clear, no lesions, no postnasal drip or exudate noted.    NECK:  Supple w/ fair ROM; no JVD; normal carotid impulses w/o bruits; no thyromegaly or nodules palpated; no lymphadenopathy.    RESP faint bibasilar crackles . no accessory muscle use, no dullness to percussion  CARD:  RRR, no m/r/g, no peripheral edema, pulses intact, no cyanosis or clubbing.  GI:   Soft & nt; nml bowel sounds; no organomegaly or masses detected.   Musco: Warm bil, no deformities or joint swelling noted.   Neuro: alert, no focal deficits noted.    Skin: Warm, no lesions or rashes    Lab Results:  CBC    Component Value Date/Time   WBC 7.8 03/28/2017 1105   RBC 5.22 03/28/2017 1105   HGB 15.8 03/28/2017 1105   HCT 46.4 03/28/2017 1105   PLT 229.0 03/28/2017 1105   MCV 89.0 03/28/2017 1105   MCHC 34.0 03/28/2017 1105   RDW 15.0 03/28/2017 1105   LYMPHSABS 1.7 03/28/2017 1105   MONOABS 0.8 03/28/2017 1105   EOSABS 0.2 03/28/2017 1105   BASOSABS 0.1 03/28/2017 1105    BMET    Component Value Date/Time   NA 139 03/28/2017 1105   K 4.4 03/28/2017 1105   CL 103 03/28/2017 1105   CO2 31 03/28/2017 1105   GLUCOSE 90 03/28/2017 1105   BUN 20 03/28/2017 1105   CREATININE 1.25 03/28/2017 1105   CALCIUM 9.3 03/28/2017 1105    BNP No results found for: BNP  ProBNP No results found for: PROBNP  Imaging: Dg Chest 2 View  Result Date: 02/28/2018 CLINICAL DATA:  Idiopathic pulmonary fibrosis. Chronic shortness of breath. No acute symptoms. EXAM: CHEST - 2 VIEW COMPARISON:  Radiographs and CT 03/20/2017. FINDINGS: The heart size and mediastinal contours are stable. There is prominent epicardial fat as correlated with prior CT. Slowly progressive, basilar predominant fibrotic changes are demonstrated in both lungs. There is no confluent airspace opacity, focal nodule or significant pleural effusion. Degenerative changes throughout the spine are noted. IMPRESSION: Slowly progressive changes of pulmonary fibrosis. No acute superimposed findings identified.  Electronically Signed   By: Richardean Sale M.D.   On: 02/28/2018 10:46     Assessment & Plan:   IPF (idiopathic pulmonary fibrosis) ([place]) Appears to be clinically stable.  Continues to not need oxygen with activity.  Patient is quite active for 84 years old.  Will check PFT on return Check chest x-ray today, if shows a decline may need to repeat CT chest high-resolution  Plan  Patient Instructions  Chest xray today  Continue on Oxygen 2l/m with activity  Follow up with Dr. Halford Chessman  With PFT in 2 months and As needed   Please contact office for sooner follow up if symptoms do not improve or worsen or seek emergency care  Nocturnal hypoxemia Continue on oxygen at bedtime.  No need for daytime oxygen     Rexene Edison, NP 02/28/2018

## 2018-02-28 NOTE — Assessment & Plan Note (Signed)
Continue on oxygen at bedtime.  No need for daytime oxygen

## 2018-02-28 NOTE — Patient Instructions (Signed)
Chest xray today  Continue on Oxygen 2l/m with activity  Follow up with Dr. Halford Chessman  With PFT in 2 months and As needed   Please contact office for sooner follow up if symptoms do not improve or worsen or seek emergency care

## 2018-02-28 NOTE — Assessment & Plan Note (Signed)
Appears to be clinically stable.  Continues to not need oxygen with activity.  Patient is quite active for 84 years old.  Will check PFT on return Check chest x-ray today, if shows a decline may need to repeat CT chest high-resolution  Plan  Patient Instructions  Chest xray today  Continue on Oxygen 2l/m with activity  Follow up with Dr. Halford Chessman  With PFT in 2 months and As needed   Please contact office for sooner follow up if symptoms do not improve or worsen or seek emergency care

## 2018-03-27 DIAGNOSIS — J8410 Pulmonary fibrosis, unspecified: Secondary | ICD-10-CM | POA: Diagnosis not present

## 2018-03-28 ENCOUNTER — Ambulatory Visit: Payer: Medicare HMO | Admitting: Pulmonary Disease

## 2018-04-26 DIAGNOSIS — J8410 Pulmonary fibrosis, unspecified: Secondary | ICD-10-CM | POA: Diagnosis not present

## 2018-04-30 ENCOUNTER — Ambulatory Visit: Payer: Medicare HMO | Admitting: Pulmonary Disease

## 2018-04-30 ENCOUNTER — Ambulatory Visit (INDEPENDENT_AMBULATORY_CARE_PROVIDER_SITE_OTHER): Payer: Medicare HMO | Admitting: Pulmonary Disease

## 2018-04-30 ENCOUNTER — Encounter: Payer: Self-pay | Admitting: Pulmonary Disease

## 2018-04-30 VITALS — BP 110/64 | HR 58 | Ht 65.0 in | Wt 187.0 lb

## 2018-04-30 DIAGNOSIS — G4734 Idiopathic sleep related nonobstructive alveolar hypoventilation: Secondary | ICD-10-CM

## 2018-04-30 DIAGNOSIS — J84112 Idiopathic pulmonary fibrosis: Secondary | ICD-10-CM

## 2018-04-30 DIAGNOSIS — J439 Emphysema, unspecified: Secondary | ICD-10-CM | POA: Diagnosis not present

## 2018-04-30 LAB — PULMONARY FUNCTION TEST
DL/VA % PRED: 93 %
DL/VA: 3.94 ml/min/mmHg/L
DLCO UNC: 14.81 ml/min/mmHg
DLCO unc % pred: 57 %
FEF 25-75 Post: 2.09 L/sec
FEF 25-75 Pre: 1.74 L/sec
FEF2575-%Change-Post: 19 %
FEF2575-%PRED-POST: 176 %
FEF2575-%Pred-Pre: 147 %
FEV1-%CHANGE-POST: 4 %
FEV1-%PRED-PRE: 87 %
FEV1-%Pred-Post: 91 %
FEV1-POST: 1.81 L
FEV1-PRE: 1.73 L
FEV1FVC-%CHANGE-POST: 1 %
FEV1FVC-%Pred-Pre: 114 %
FEV6-%Change-Post: 2 %
FEV6-%PRED-PRE: 81 %
FEV6-%Pred-Post: 83 %
FEV6-Post: 2.21 L
FEV6-Pre: 2.15 L
FEV6FVC-%PRED-POST: 109 %
FEV6FVC-%Pred-Pre: 109 %
FVC-%Change-Post: 2 %
FVC-%PRED-POST: 76 %
FVC-%PRED-PRE: 73 %
FVC-POST: 2.21 L
FVC-PRE: 2.15 L
PRE FEV6/FVC RATIO: 100 %
Post FEV1/FVC ratio: 82 %
Post FEV6/FVC ratio: 100 %
Pre FEV1/FVC ratio: 80 %
RV % PRED: 66 %
RV: 1.67 L
TLC % pred: 65 %
TLC: 3.99 L

## 2018-04-30 NOTE — Patient Instructions (Signed)
Call if you would like to try one of the medications for pulmonary fibrosis (Esbriet or Ofev)  Follow up in 6 months

## 2018-04-30 NOTE — Progress Notes (Signed)
[facility], [facility] Sleep [facility]  Chief Complaint  Patient presents with  . Follow-up    Breathing is unchanged since last OV. Reports SOB, chest tightness, wheezing and coughing. PFT was completed today.    Vital signs: BP 110/64 (BP Location: Left Arm, Cuff Size: Normal)   Pulse (!) 58   Ht 5\' 5"  (1.651 m)   Wt 187 lb (84.8 kg)   SpO2 98%   BMI 31.12 kg/m   History of Present Illness: Dalton Flores is a 84 y.o. male with dyspnea and pulmonary fibrosis with IPF pattern.  He gets winded with activity.  He has occasional dry cough.  Using oxygen at night.  Not having fever, chest pain, hemoptysis, skin rash, joint swelling, diarrhea, or leg swelling.  Not on any medications now.  PFT today shows progressive restriction and diffusion defect compared to 2018.  Physical Exam:  General - pleasant Eyes - pupils reactive ENT - no sinus tenderness, no oral exudate, no LAN Cardiac - regular, no murmur Chest - basilar crackles Abd - soft, non tender Ext - no edema Skin - no rashes Neuro - normal strength Psych - normal mood  Assessment/Plan:  ILD with IPF pattern. - he has progression recent CXR and PFT - discussed esbriet and ofev - he will consider these and call if he wants to try medication - if he does, then he would need repeat lab and CT chest testing  Nocturnal hypoxemia. - continue supplemental oxygen at night   Patient Instructions  Call if you would like to try one of the medications for pulmonary fibrosis (Esbriet or Ofev)  Follow up in 6 months    Chesley Mires, MD [place] 04/30/2018, 12:07 PM  Flow Sheet  Pulmonary tests: CT angio chest 03/20/17 >> b/l peripheral fibrotic changes Serology 03/28/17 >> ANCA, ANA, SCL 70, SSA/SSB, Anti Jo, Anti CCP, RF negative PFT 04/25/17 >> FEV1 1.90 (114%), FEV1% 78, TLC 4.20 (76%), DLCO 68%, no BD Esophagram 06/12/17 >> Mod GERD, mild/mod esophageal dysmotility PFT  04/30/18 >> FEV1 1.81 (91%), FEV1% 82, TLC 3.99 (65%), DLCO 57%, no BD  Cardiac tests: Doppler Rt leg 08/28/17 >> no DVT Echo 08/29/17 >> EF 55 to 60%, mod TR, enlarged RV, impaired diastolic relaxation  Past Medical History: He  has a past medical history of Emphysema lung ([place]).  Past Surgical History: He  has a past surgical history that includes Cholecystectomy (08/2016); Back surgery (2005); Prostatectomy (2005); Appendectomy (1946); and Hernia repair (1978).  Family History: His family history includes Brain cancer in his sister; Emphysema in his father; Heart disease in his mother.  Social History: He  reports that he has never smoked. He has never used smokeless tobacco. He reports that he does not drink alcohol or use drugs.  Medications: Allergies as of 04/30/2018   No Known Allergies     Medication List    as of 04/30/2018 12:07 PM   You have not been prescribed any medications.

## 2018-04-30 NOTE — Progress Notes (Signed)
PFT completed today.  

## 2018-05-27 DIAGNOSIS — J8410 Pulmonary fibrosis, unspecified: Secondary | ICD-10-CM | POA: Diagnosis not present

## 2018-06-26 DIAGNOSIS — J8410 Pulmonary fibrosis, unspecified: Secondary | ICD-10-CM | POA: Diagnosis not present

## 2018-07-22 DIAGNOSIS — H524 Presbyopia: Secondary | ICD-10-CM | POA: Diagnosis not present

## 2018-07-22 DIAGNOSIS — H353232 Exudative age-related macular degeneration, bilateral, with inactive choroidal neovascularization: Secondary | ICD-10-CM | POA: Diagnosis not present

## 2018-07-22 DIAGNOSIS — H26493 Other secondary cataract, bilateral: Secondary | ICD-10-CM | POA: Diagnosis not present

## 2018-07-27 DIAGNOSIS — J8410 Pulmonary fibrosis, unspecified: Secondary | ICD-10-CM | POA: Diagnosis not present

## 2018-08-27 DIAGNOSIS — J8410 Pulmonary fibrosis, unspecified: Secondary | ICD-10-CM | POA: Diagnosis not present

## 2018-09-17 DIAGNOSIS — Z23 Encounter for immunization: Secondary | ICD-10-CM | POA: Diagnosis not present

## 2018-09-26 DIAGNOSIS — J8410 Pulmonary fibrosis, unspecified: Secondary | ICD-10-CM | POA: Diagnosis not present

## 2018-10-24 DIAGNOSIS — H353232 Exudative age-related macular degeneration, bilateral, with inactive choroidal neovascularization: Secondary | ICD-10-CM | POA: Diagnosis not present

## 2018-10-27 DIAGNOSIS — J8410 Pulmonary fibrosis, unspecified: Secondary | ICD-10-CM | POA: Diagnosis not present

## 2018-11-26 DIAGNOSIS — J8410 Pulmonary fibrosis, unspecified: Secondary | ICD-10-CM | POA: Diagnosis not present

## 2018-12-25 DIAGNOSIS — R42 Dizziness and giddiness: Secondary | ICD-10-CM | POA: Diagnosis not present

## 2018-12-25 DIAGNOSIS — H6123 Impacted cerumen, bilateral: Secondary | ICD-10-CM | POA: Diagnosis not present

## 2018-12-25 DIAGNOSIS — H903 Sensorineural hearing loss, bilateral: Secondary | ICD-10-CM | POA: Diagnosis not present

## 2018-12-27 DIAGNOSIS — J8410 Pulmonary fibrosis, unspecified: Secondary | ICD-10-CM | POA: Diagnosis not present

## 2019-01-05 DIAGNOSIS — N5231 Erectile dysfunction following radical prostatectomy: Secondary | ICD-10-CM | POA: Diagnosis not present

## 2019-01-05 DIAGNOSIS — J9611 Chronic respiratory failure with hypoxia: Secondary | ICD-10-CM | POA: Diagnosis not present

## 2019-01-05 DIAGNOSIS — E668 Other obesity: Secondary | ICD-10-CM | POA: Diagnosis not present

## 2019-01-05 DIAGNOSIS — Z0001 Encounter for general adult medical examination with abnormal findings: Secondary | ICD-10-CM | POA: Diagnosis not present

## 2019-01-05 DIAGNOSIS — Z23 Encounter for immunization: Secondary | ICD-10-CM | POA: Diagnosis not present

## 2019-01-05 DIAGNOSIS — Z6832 Body mass index (BMI) 32.0-32.9, adult: Secondary | ICD-10-CM | POA: Diagnosis not present

## 2019-01-05 DIAGNOSIS — M25511 Pain in right shoulder: Secondary | ICD-10-CM | POA: Diagnosis not present

## 2019-01-05 DIAGNOSIS — R0602 Shortness of breath: Secondary | ICD-10-CM | POA: Diagnosis not present

## 2019-01-21 IMAGING — DX DG CHEST 2V
2 series · 2 of 2 positions shown · non-contrast
Comparison: Radiographs and CT 03/20/2017.

CLINICAL DATA: Idiopathic pulmonary fibrosis. Chronic shortness of
breath. No acute symptoms.

EXAM:
CHEST - 2 VIEW

[chest pa]
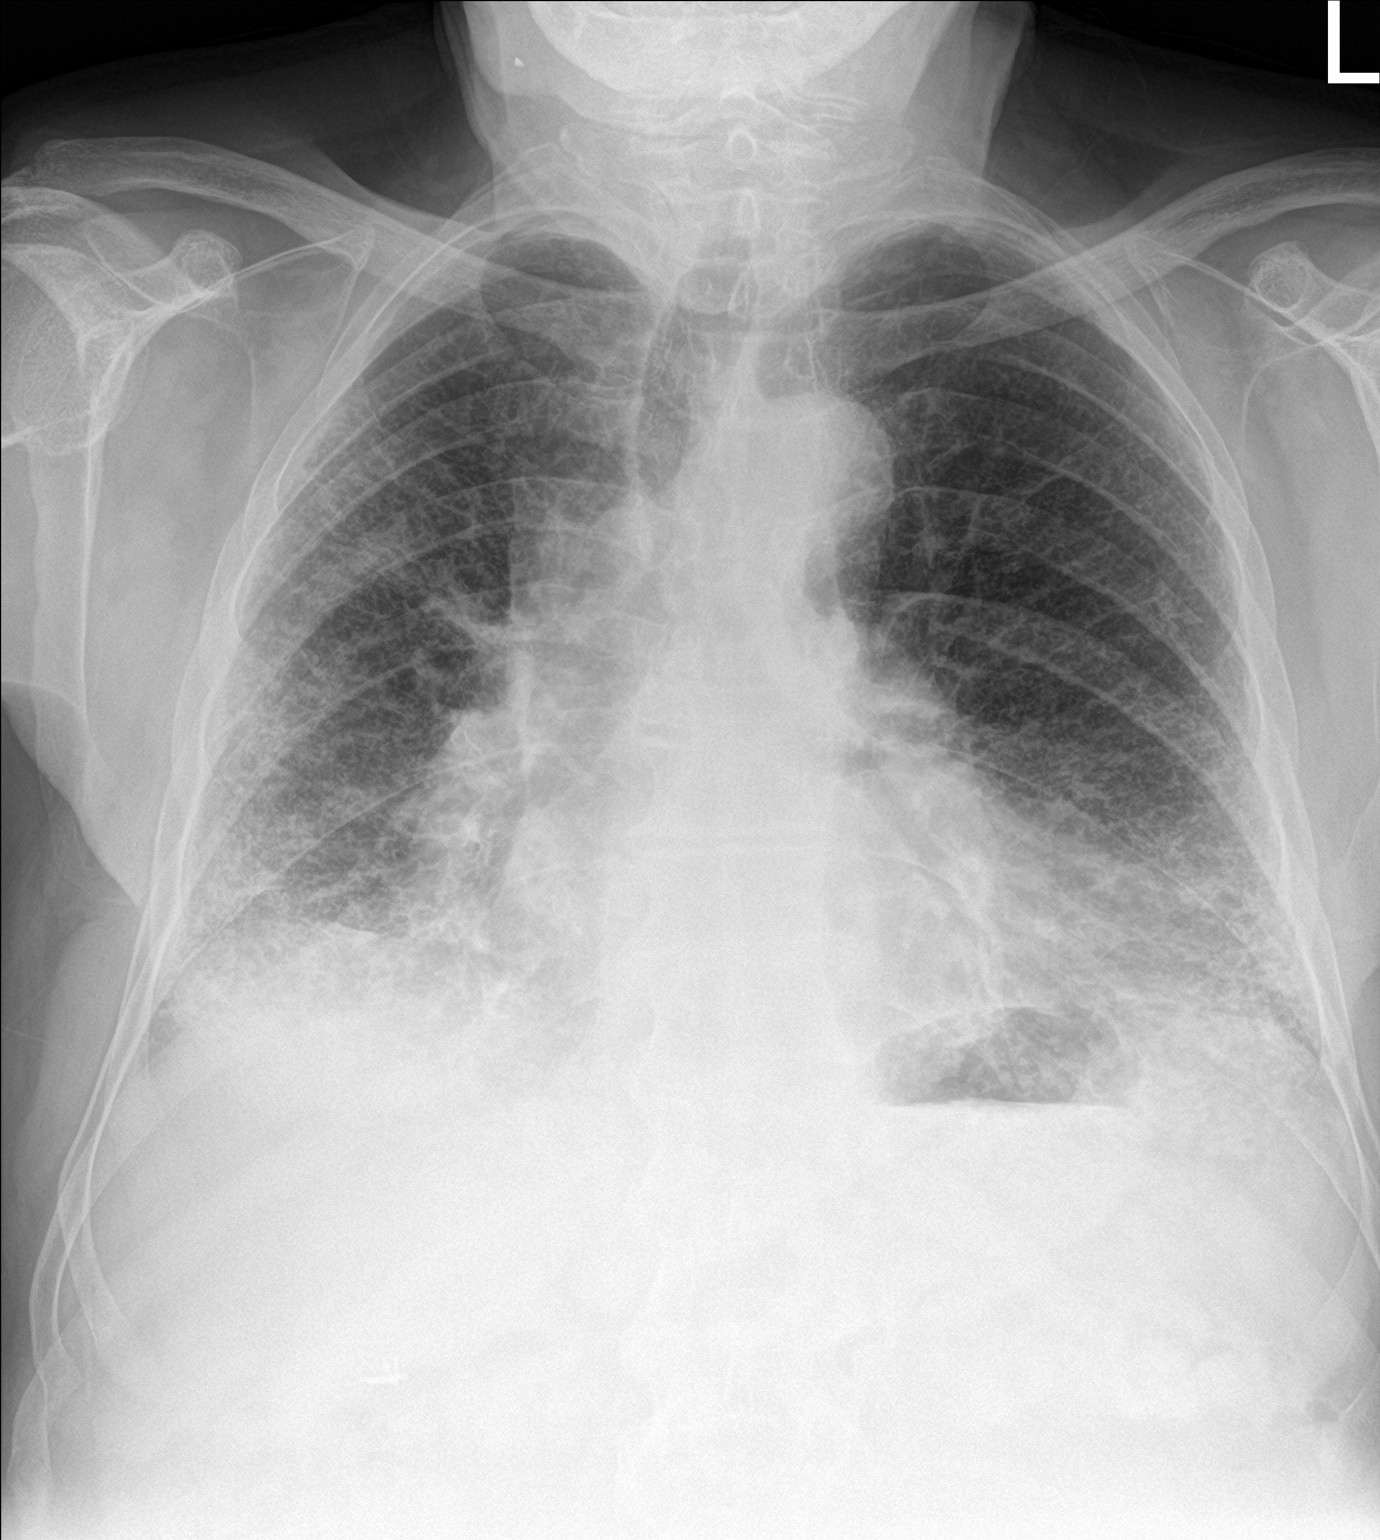

[chest lat]
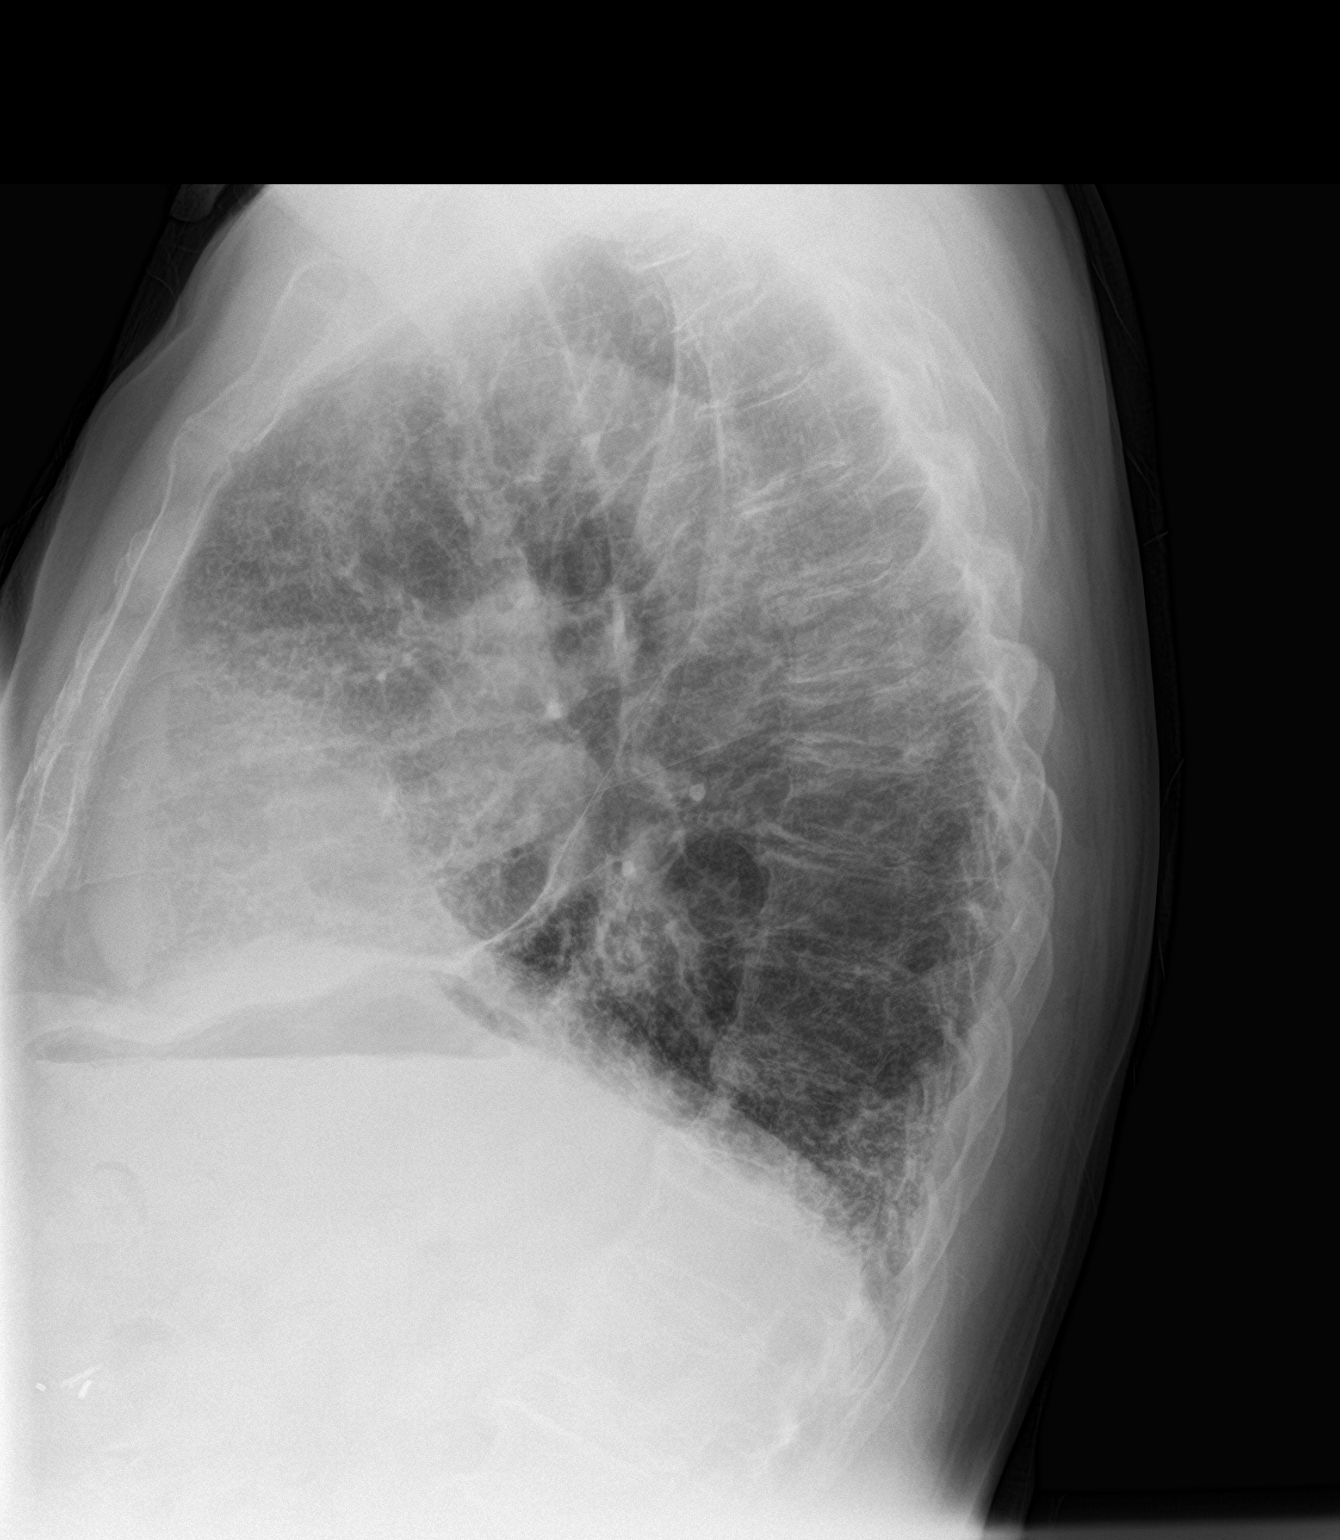

[2 of 2 positions shown; findings below may reference images not displayed]

FINDINGS: The heart size and mediastinal contours are stable. There is
prominent epicardial fat as correlated with prior CT. Slowly
progressive, basilar predominant fibrotic changes are demonstrated
in both lungs. There is no confluent airspace opacity, focal nodule
or significant pleural effusion. Degenerative changes throughout the
spine are noted.
IMPRESSION: Slowly progressive changes of pulmonary fibrosis. No acute
superimposed findings identified.

## 2019-01-27 DIAGNOSIS — J8410 Pulmonary fibrosis, unspecified: Secondary | ICD-10-CM | POA: Diagnosis not present

## 2019-02-25 DIAGNOSIS — J8410 Pulmonary fibrosis, unspecified: Secondary | ICD-10-CM | POA: Diagnosis not present

## 2019-03-28 DIAGNOSIS — J8410 Pulmonary fibrosis, unspecified: Secondary | ICD-10-CM | POA: Diagnosis not present

## 2019-04-15 DIAGNOSIS — H353113 Nonexudative age-related macular degeneration, right eye, advanced atrophic without subfoveal involvement: Secondary | ICD-10-CM | POA: Diagnosis not present

## 2019-04-27 DIAGNOSIS — J8410 Pulmonary fibrosis, unspecified: Secondary | ICD-10-CM | POA: Diagnosis not present

## 2019-05-28 DIAGNOSIS — J8410 Pulmonary fibrosis, unspecified: Secondary | ICD-10-CM | POA: Diagnosis not present

## 2019-06-27 DIAGNOSIS — J8410 Pulmonary fibrosis, unspecified: Secondary | ICD-10-CM | POA: Diagnosis not present

## 2019-07-14 DIAGNOSIS — R42 Dizziness and giddiness: Secondary | ICD-10-CM | POA: Diagnosis not present

## 2019-07-14 DIAGNOSIS — M25511 Pain in right shoulder: Secondary | ICD-10-CM | POA: Diagnosis not present

## 2019-07-14 DIAGNOSIS — M25512 Pain in left shoulder: Secondary | ICD-10-CM | POA: Diagnosis not present

## 2019-07-14 DIAGNOSIS — R350 Frequency of micturition: Secondary | ICD-10-CM | POA: Diagnosis not present

## 2019-07-14 DIAGNOSIS — F320 Major depressive disorder, single episode, mild: Secondary | ICD-10-CM | POA: Diagnosis not present

## 2019-07-16 DIAGNOSIS — Z6833 Body mass index (BMI) 33.0-33.9, adult: Secondary | ICD-10-CM | POA: Diagnosis not present

## 2019-07-16 DIAGNOSIS — R42 Dizziness and giddiness: Secondary | ICD-10-CM | POA: Diagnosis not present

## 2019-07-16 DIAGNOSIS — R350 Frequency of micturition: Secondary | ICD-10-CM | POA: Diagnosis not present

## 2019-07-16 DIAGNOSIS — M7551 Bursitis of right shoulder: Secondary | ICD-10-CM | POA: Diagnosis not present

## 2019-07-16 DIAGNOSIS — F320 Major depressive disorder, single episode, mild: Secondary | ICD-10-CM | POA: Diagnosis not present

## 2019-07-16 DIAGNOSIS — M25512 Pain in left shoulder: Secondary | ICD-10-CM | POA: Diagnosis not present

## 2019-07-17 DIAGNOSIS — R946 Abnormal results of thyroid function studies: Secondary | ICD-10-CM | POA: Diagnosis not present

## 2019-07-20 DIAGNOSIS — N3000 Acute cystitis without hematuria: Secondary | ICD-10-CM | POA: Diagnosis not present

## 2019-07-20 DIAGNOSIS — Z6832 Body mass index (BMI) 32.0-32.9, adult: Secondary | ICD-10-CM | POA: Diagnosis not present

## 2019-07-20 DIAGNOSIS — L2081 Atopic neurodermatitis: Secondary | ICD-10-CM | POA: Diagnosis not present

## 2019-07-28 DIAGNOSIS — J8410 Pulmonary fibrosis, unspecified: Secondary | ICD-10-CM | POA: Diagnosis not present

## 2019-07-30 DIAGNOSIS — Z23 Encounter for immunization: Secondary | ICD-10-CM | POA: Diagnosis not present

## 2019-07-30 DIAGNOSIS — R42 Dizziness and giddiness: Secondary | ICD-10-CM | POA: Diagnosis not present

## 2019-07-30 DIAGNOSIS — E034 Atrophy of thyroid (acquired): Secondary | ICD-10-CM | POA: Diagnosis not present

## 2019-07-30 DIAGNOSIS — J9611 Chronic respiratory failure with hypoxia: Secondary | ICD-10-CM | POA: Diagnosis not present

## 2019-07-30 DIAGNOSIS — J84112 Idiopathic pulmonary fibrosis: Secondary | ICD-10-CM | POA: Diagnosis not present

## 2019-07-30 DIAGNOSIS — R5383 Other fatigue: Secondary | ICD-10-CM | POA: Diagnosis not present

## 2019-08-17 ENCOUNTER — Ambulatory Visit: Payer: Medicare HMO | Admitting: Pulmonary Disease

## 2019-08-28 DIAGNOSIS — J8410 Pulmonary fibrosis, unspecified: Secondary | ICD-10-CM | POA: Diagnosis not present

## 2019-09-27 DIAGNOSIS — J8410 Pulmonary fibrosis, unspecified: Secondary | ICD-10-CM | POA: Diagnosis not present

## 2019-10-28 DIAGNOSIS — J8410 Pulmonary fibrosis, unspecified: Secondary | ICD-10-CM | POA: Diagnosis not present

## 2019-11-02 DIAGNOSIS — R5383 Other fatigue: Secondary | ICD-10-CM | POA: Diagnosis not present

## 2019-11-02 DIAGNOSIS — R3121 Asymptomatic microscopic hematuria: Secondary | ICD-10-CM | POA: Diagnosis not present

## 2019-11-02 DIAGNOSIS — E034 Atrophy of thyroid (acquired): Secondary | ICD-10-CM | POA: Diagnosis not present

## 2019-11-02 DIAGNOSIS — R1031 Right lower quadrant pain: Secondary | ICD-10-CM | POA: Diagnosis not present

## 2019-11-16 DIAGNOSIS — C61 Malignant neoplasm of prostate: Secondary | ICD-10-CM | POA: Diagnosis not present

## 2019-11-16 DIAGNOSIS — R339 Retention of urine, unspecified: Secondary | ICD-10-CM | POA: Diagnosis not present

## 2019-11-16 DIAGNOSIS — R300 Dysuria: Secondary | ICD-10-CM | POA: Diagnosis not present

## 2019-11-20 DIAGNOSIS — N433 Hydrocele, unspecified: Secondary | ICD-10-CM | POA: Diagnosis not present

## 2019-11-20 DIAGNOSIS — N452 Orchitis: Secondary | ICD-10-CM | POA: Diagnosis not present

## 2019-11-27 DIAGNOSIS — J8410 Pulmonary fibrosis, unspecified: Secondary | ICD-10-CM | POA: Diagnosis not present

## 2019-12-03 DIAGNOSIS — N35912 Unspecified bulbous urethral stricture, male: Secondary | ICD-10-CM | POA: Diagnosis not present

## 2019-12-10 DIAGNOSIS — Z01812 Encounter for preprocedural laboratory examination: Secondary | ICD-10-CM | POA: Diagnosis not present

## 2019-12-10 DIAGNOSIS — N35919 Unspecified urethral stricture, male, unspecified site: Secondary | ICD-10-CM | POA: Diagnosis not present

## 2019-12-10 DIAGNOSIS — Z20822 Contact with and (suspected) exposure to covid-19: Secondary | ICD-10-CM | POA: Diagnosis not present

## 2019-12-16 DIAGNOSIS — Z8546 Personal history of malignant neoplasm of prostate: Secondary | ICD-10-CM | POA: Diagnosis not present

## 2019-12-16 DIAGNOSIS — N35912 Unspecified bulbous urethral stricture, male: Secondary | ICD-10-CM | POA: Diagnosis not present

## 2019-12-16 DIAGNOSIS — N35919 Unspecified urethral stricture, male, unspecified site: Secondary | ICD-10-CM | POA: Insufficient documentation

## 2019-12-16 DIAGNOSIS — R319 Hematuria, unspecified: Secondary | ICD-10-CM | POA: Diagnosis not present

## 2019-12-16 DIAGNOSIS — N99114 Postprocedural urethral stricture, male, unspecified: Secondary | ICD-10-CM | POA: Diagnosis not present

## 2019-12-16 HISTORY — PX: BLADDER SURGERY: SHX569

## 2019-12-24 DIAGNOSIS — N35912 Unspecified bulbous urethral stricture, male: Secondary | ICD-10-CM | POA: Diagnosis not present

## 2019-12-28 DIAGNOSIS — J8410 Pulmonary fibrosis, unspecified: Secondary | ICD-10-CM | POA: Diagnosis not present

## 2020-01-04 DIAGNOSIS — H353232 Exudative age-related macular degeneration, bilateral, with inactive choroidal neovascularization: Secondary | ICD-10-CM | POA: Diagnosis not present

## 2020-01-07 DIAGNOSIS — N35912 Unspecified bulbous urethral stricture, male: Secondary | ICD-10-CM | POA: Diagnosis not present

## 2020-01-28 DIAGNOSIS — J8410 Pulmonary fibrosis, unspecified: Secondary | ICD-10-CM | POA: Diagnosis not present

## 2020-02-03 ENCOUNTER — Other Ambulatory Visit: Payer: Self-pay

## 2020-02-03 ENCOUNTER — Encounter: Payer: Self-pay | Admitting: Family Medicine

## 2020-02-03 ENCOUNTER — Ambulatory Visit (INDEPENDENT_AMBULATORY_CARE_PROVIDER_SITE_OTHER): Payer: Medicare HMO | Admitting: Family Medicine

## 2020-02-03 VITALS — BP 112/68 | HR 80 | Temp 97.7°F | Resp 18 | Ht 64.0 in | Wt 185.0 lb

## 2020-02-03 DIAGNOSIS — G4734 Idiopathic sleep related nonobstructive alveolar hypoventilation: Secondary | ICD-10-CM

## 2020-02-03 DIAGNOSIS — J84112 Idiopathic pulmonary fibrosis: Secondary | ICD-10-CM

## 2020-02-03 DIAGNOSIS — H906 Mixed conductive and sensorineural hearing loss, bilateral: Secondary | ICD-10-CM | POA: Diagnosis not present

## 2020-02-03 DIAGNOSIS — K219 Gastro-esophageal reflux disease without esophagitis: Secondary | ICD-10-CM | POA: Diagnosis not present

## 2020-02-03 NOTE — Progress Notes (Signed)
Subjective:  Patient ID: Dalton Flores, male    DOB: May 25, 1929  Age: 86 y.o. MRN: [id number]  Chief Complaint  Patient presents with  . Hypothyroidism    HPI Patient is an 86 year old white male who presents for follow-up.  Patient has idiopathic pulmonary fibrosis, chronic bronchitis, and GERD.  Patient's breathing is stable.  GERD is well controlled on omeprazole.    Social History   Socioeconomic History  . Marital status: Married    Spouse name: Not on file  . Number of children: Not on file  . Years of education: Not on file  . Highest education level: Not on file  Occupational History  . Occupation: retired  Tobacco Use  . Smoking status: Never Smoker  . Smokeless tobacco: Never Used  Substance and Sexual Activity  . Alcohol use: No  . Drug use: No  . Sexual activity: Not on file  Other Topics Concern  . Not on file  Social History Narrative  . Not on file   Social Determinants of Health   Financial Resource Strain:   . Difficulty of Paying Living Expenses:   Food Insecurity:   . Worried About Charity fundraiser in the Last Year:   . Arboriculturist in the Last Year:   Transportation Needs:   . Film/video editor (Medical):   Marland Kitchen Lack of Transportation (Non-Medical):   Physical Activity:   . Days of Exercise per Week:   . Minutes of Exercise per Session:   Stress:   . Feeling of Stress :   Social Connections:   . Frequency of Communication with Friends and Family:   . Frequency of Social Gatherings with Friends and Family:   . Attends Religious Services:   . Active Member of Clubs or Organizations:   . Attends Archivist Meetings:   Marland Kitchen Marital Status:    Past Medical History:  Diagnosis Date  . Emphysema lung ([place])   . Hearing loss   . Hypothyroid   . Macular degeneration   . Meniere disease   . Prostate cancer ([place]) 2008  . Pulmonary fibrosis (HCC)    Family History  Problem Relation Age of Onset  . Emphysema Father   .  Coronary artery disease Father   . CVA Father   . Heart disease Mother   . Brain cancer Sister   . Parkinson's disease Brother   . Heart attack Brother     Review of Systems  Constitutional: Negative for chills, fatigue and fever.  HENT: Negative for congestion, ear pain and sore throat.   Respiratory: Negative for cough and shortness of breath.   Cardiovascular: Negative for chest pain.  Gastrointestinal: Negative for abdominal pain, constipation, diarrhea, nausea and vomiting.  Endocrine: Negative for polydipsia, polyphagia and polyuria.  Genitourinary: Negative for dysuria and frequency.  Musculoskeletal: Negative for arthralgias and myalgias.  Neurological: Negative for dizziness and headaches.  Psychiatric/Behavioral: Negative for dysphoric mood.       No dysphoria     Objective:  BP 112/68   Pulse 80   Temp 97.7 F (36.5 C)   Resp 18   Ht 5\' 4"  (1.626 m)   Wt 185 lb (83.9 kg)   BMI 31.76 kg/m   BP/Weight 02/03/2020 04/30/2018 0000000  Systolic BP [id number]  Diastolic BP 68 64 66  Wt. (Lbs) 185 187 189.6  BMI 31.76 31.12 31.55    Physical Exam Vitals reviewed.  Constitutional:  Appearance: Normal appearance.  Neck:     Vascular: No carotid bruit.  Cardiovascular:     Rate and Rhythm: Normal rate and regular rhythm.     Pulses: Normal pulses.     Heart sounds: Normal heart sounds.  Pulmonary:     Effort: Pulmonary effort is normal.     Breath sounds: Normal breath sounds. No wheezing, rhonchi or rales.  Abdominal:     General: Bowel sounds are normal.     Palpations: Abdomen is soft.     Tenderness: There is no abdominal tenderness.  Neurological:     Mental Status: He is alert.  Psychiatric:        Mood and Affect: Mood normal.        Behavior: Behavior normal.     Lab Results  Component Value Date   WBC 7.8 03/28/2017   HGB 15.8 03/28/2017   HCT 46.4 03/28/2017   PLT 229.0 03/28/2017   GLUCOSE 90 03/28/2017   ALT 10 03/28/2017   AST  15 03/28/2017   NA 139 03/28/2017   K 4.4 03/28/2017   CL 103 03/28/2017   CREATININE 1.25 03/28/2017   BUN 20 03/28/2017   CO2 31 03/28/2017      Assessment & Plan:  IPF (idiopathic pulmonary fibrosis) ([place]) Reviewed history. Stable.   Nocturnal hypoxemia Continue on nighttime oxygen at 2 L. No daytime oxygen needed.  GERD without esophagitis Continue omeprazole 40 mg once daily.  Well-controlled.      Follow-up: Return in about 4 months (around 06/04/2020).  AVS was given to patient prior to departure.  Rochel Brome [facility] [phone]

## 2020-02-15 ENCOUNTER — Encounter: Payer: Self-pay | Admitting: Family Medicine

## 2020-02-15 DIAGNOSIS — K219 Gastro-esophageal reflux disease without esophagitis: Secondary | ICD-10-CM | POA: Insufficient documentation

## 2020-02-15 NOTE — Assessment & Plan Note (Signed)
Continue omeprazole 40 mg once daily.  Well-controlled.

## 2020-02-15 NOTE — Assessment & Plan Note (Signed)
Reviewed history. Stable.

## 2020-02-15 NOTE — Patient Instructions (Addendum)
IPF (idiopathic pulmonary fibrosis) ([place]) Reviewed history. Stable.   Nocturnal hypoxemia Continue on nighttime oxygen at 2 L. No daytime oxygen needed.  GERD without esophagitis Continue omeprazole 40 mg once daily.  Well-controlled.

## 2020-02-15 NOTE — Assessment & Plan Note (Signed)
Continue on nighttime oxygen at 2 L. No daytime oxygen needed.

## 2020-02-25 DIAGNOSIS — J8410 Pulmonary fibrosis, unspecified: Secondary | ICD-10-CM | POA: Diagnosis not present

## 2020-03-14 DIAGNOSIS — C61 Malignant neoplasm of prostate: Secondary | ICD-10-CM | POA: Diagnosis not present

## 2020-03-14 DIAGNOSIS — N50819 Testicular pain, unspecified: Secondary | ICD-10-CM | POA: Diagnosis not present

## 2020-03-27 DIAGNOSIS — J8410 Pulmonary fibrosis, unspecified: Secondary | ICD-10-CM | POA: Diagnosis not present

## 2020-04-26 DIAGNOSIS — J8410 Pulmonary fibrosis, unspecified: Secondary | ICD-10-CM | POA: Diagnosis not present

## 2020-05-27 DIAGNOSIS — J8410 Pulmonary fibrosis, unspecified: Secondary | ICD-10-CM | POA: Diagnosis not present

## 2020-06-07 ENCOUNTER — Ambulatory Visit: Payer: Medicare HMO | Admitting: Family Medicine

## 2020-06-26 DIAGNOSIS — J8410 Pulmonary fibrosis, unspecified: Secondary | ICD-10-CM | POA: Diagnosis not present

## 2020-07-13 DIAGNOSIS — H353133 Nonexudative age-related macular degeneration, bilateral, advanced atrophic without subfoveal involvement: Secondary | ICD-10-CM | POA: Diagnosis not present

## 2020-07-25 ENCOUNTER — Ambulatory Visit (INDEPENDENT_AMBULATORY_CARE_PROVIDER_SITE_OTHER): Payer: Medicare HMO | Admitting: Family Medicine

## 2020-07-25 ENCOUNTER — Encounter: Payer: Self-pay | Admitting: Family Medicine

## 2020-07-25 ENCOUNTER — Other Ambulatory Visit: Payer: Self-pay

## 2020-07-25 VITALS — BP 120/70 | HR 61 | Temp 97.2°F | Ht 64.0 in | Wt 185.0 lb

## 2020-07-25 DIAGNOSIS — K219 Gastro-esophageal reflux disease without esophagitis: Secondary | ICD-10-CM

## 2020-07-25 DIAGNOSIS — Z6831 Body mass index (BMI) 31.0-31.9, adult: Secondary | ICD-10-CM

## 2020-07-25 DIAGNOSIS — E6609 Other obesity due to excess calories: Secondary | ICD-10-CM

## 2020-07-25 DIAGNOSIS — J84112 Idiopathic pulmonary fibrosis: Secondary | ICD-10-CM

## 2020-07-25 DIAGNOSIS — H8103 Meniere's disease, bilateral: Secondary | ICD-10-CM

## 2020-07-25 NOTE — Progress Notes (Signed)
Subjective:  Patient ID: Dalton Flores, male    DOB: 09-May-1929  Age: 86 y.o. MRN: [id number]  Chief Complaint  Patient presents with  . Gastroesophageal Reflux  . Shortness of Breath    HPI Patient is an 86 year old white male who presents for follow-up.  Patient has idiopathic pulmonary fibrosis, chronic bronchitis, and GERD.    GERD: not on any medicines.   Idiopathic Pulmonary fibrosis: Pt has DOE, but is stable.   Dizziness: consistent with meniere's disease. Very frustrating.  Social History   Socioeconomic History  . Marital status: Married    Spouse name: Not on file  . Number of children: Not on file  . Years of education: Not on file  . Highest education level: Not on file  Occupational History  . Occupation: retired  Tobacco Use  . Smoking status: Never Smoker  . Smokeless tobacco: Never Used  Vaping Use  . Vaping Use: Never used  Substance and Sexual Activity  . Alcohol use: No  . Drug use: No  . Sexual activity: Not on file  Other Topics Concern  . Not on file  Social History Narrative  . Not on file   Social Determinants of Health   Financial Resource Strain:   . Difficulty of Paying Living Expenses: Not on file  Food Insecurity:   . Worried About Charity fundraiser in the Last Year: Not on file  . Ran Out of Food in the Last Year: Not on file  Transportation Needs:   . Lack of Transportation (Medical): Not on file  . Lack of Transportation (Non-Medical): Not on file  Physical Activity:   . Days of Exercise per Week: Not on file  . Minutes of Exercise per Session: Not on file  Stress:   . Feeling of Stress : Not on file  Social Connections:   . Frequency of Communication with Friends and Family: Not on file  . Frequency of Social Gatherings with Friends and Family: Not on file  . Attends Religious Services: Not on file  . Active Member of Clubs or Organizations: Not on file  . Attends Archivist Meetings: Not on file  .  Marital Status: Not on file   Past Medical History:  Diagnosis Date  . Emphysema lung ([place])   . Hearing loss   . Hypothyroid   . Macular degeneration   . Meniere disease   . Prostate cancer ([place]) 2008  . Pulmonary fibrosis (HCC)    Family History  Problem Relation Age of Onset  . Emphysema Father   . Coronary artery disease Father   . CVA Father   . Heart disease Mother   . Brain cancer Sister   . Parkinson's disease Brother   . Heart attack Brother     Review of Systems  Constitutional: Negative for chills, fatigue and fever.  HENT: Positive for rhinorrhea. Negative for congestion, ear pain and sore throat.   Respiratory: Positive for shortness of breath. Negative for cough.   Cardiovascular: Negative for chest pain.  Gastrointestinal: Negative for abdominal pain, constipation, diarrhea, nausea and vomiting.  Endocrine: Positive for polyuria (At night 3-4 times). Negative for polydipsia and polyphagia.  Genitourinary: Negative for dysuria and frequency.  Musculoskeletal: Negative for arthralgias and myalgias.  Neurological: Positive for dizziness. Negative for headaches.  Psychiatric/Behavioral: Negative for dysphoric mood. The patient is not nervous/anxious.        No dysphoria     Objective:  BP 120/70   Pulse  61   Temp (!) 97.2 F (36.2 C)   Ht 5\' 4"  (1.626 m)   Wt 185 lb (83.9 kg)   SpO2 100%   BMI 31.76 kg/m   BP/Weight 07/25/2020 02/03/2020 5/59/7416  Systolic BP 384 536 468  Diastolic BP 70 68 64  Wt. (Lbs) 185 185 187  BMI 31.76 31.76 31.12    Physical Exam Vitals reviewed.  Constitutional:      Appearance: Normal appearance.  Neck:     Vascular: No carotid bruit.  Cardiovascular:     Rate and Rhythm: Normal rate and regular rhythm.     Pulses: Normal pulses.     Heart sounds: Normal heart sounds.  Pulmonary:     Effort: Pulmonary effort is normal.     Breath sounds: Normal breath sounds. No wheezing, rhonchi or rales.  Abdominal:      General: Bowel sounds are normal.     Palpations: Abdomen is soft.     Tenderness: There is no abdominal tenderness.  Neurological:     Mental Status: He is alert.  Psychiatric:        Mood and Affect: Mood normal.        Behavior: Behavior normal.     Lab Results  Component Value Date   WBC 7.8 03/28/2017   HGB 15.8 03/28/2017   HCT 46.4 03/28/2017   PLT 229.0 03/28/2017   GLUCOSE 90 03/28/2017   ALT 10 03/28/2017   AST 15 03/28/2017   NA 139 03/28/2017   K 4.4 03/28/2017   CL 103 03/28/2017   CREATININE 1.25 03/28/2017   BUN 20 03/28/2017   CO2 31 03/28/2017      Assessment & Plan:  1. IPF (idiopathic pulmonary fibrosis) ([place]) No treatment at this time.   2. Meniere's disease of both ears/dizziness Caution with walking. - CBC with Differential/Platelet - Comprehensive metabolic panel - TSH  3. Class 1 obesity due to excess calories without serious comorbidity with body mass index (BMI) of 31.0 to 31.9 in adult - Lipid panel  4. GERD without esophagitis No need for meds.   Follow-up: Return in about 6 months (around 01/25/2021) for high dose flu shot in September. ..  AVS was given to patient prior to departure.  Rochel Brome [facility] [phone]

## 2020-07-27 DIAGNOSIS — J8410 Pulmonary fibrosis, unspecified: Secondary | ICD-10-CM | POA: Diagnosis not present

## 2020-08-23 ENCOUNTER — Ambulatory Visit (INDEPENDENT_AMBULATORY_CARE_PROVIDER_SITE_OTHER): Payer: Medicare Other

## 2020-08-23 ENCOUNTER — Other Ambulatory Visit: Payer: Self-pay

## 2020-08-23 DIAGNOSIS — Z23 Encounter for immunization: Secondary | ICD-10-CM

## 2020-08-23 NOTE — Progress Notes (Signed)
   Covid-19 Vaccination Clinic  Name:  DUVALL COMES    MRN: [id number] DOB: [birth year]  08/23/2020  Mr. Whitmoyer was observed post Covid-19 immunization for 15 minutes without incident. He was provided with Vaccine Information Sheet and instruction to access the V-Safe system.   Mr. Whistler was instructed to call 911 with any severe reactions post vaccine: Marland Kitchen Difficulty breathing  . Swelling of face and throat  . A fast heartbeat  . A bad rash all over body  . Dizziness and weakness

## 2020-08-27 DIAGNOSIS — J8410 Pulmonary fibrosis, unspecified: Secondary | ICD-10-CM | POA: Diagnosis not present

## 2020-08-31 ENCOUNTER — Ambulatory Visit: Payer: Medicare HMO

## 2020-09-26 DIAGNOSIS — J8410 Pulmonary fibrosis, unspecified: Secondary | ICD-10-CM | POA: Diagnosis not present

## 2020-10-04 DIAGNOSIS — N50819 Testicular pain, unspecified: Secondary | ICD-10-CM | POA: Diagnosis not present

## 2020-10-04 DIAGNOSIS — C61 Malignant neoplasm of prostate: Secondary | ICD-10-CM | POA: Diagnosis not present

## 2020-10-12 ENCOUNTER — Telehealth: Payer: Self-pay

## 2020-10-12 NOTE — Telephone Encounter (Signed)
Pt scheduled for awv

## 2020-10-18 ENCOUNTER — Ambulatory Visit (INDEPENDENT_AMBULATORY_CARE_PROVIDER_SITE_OTHER): Payer: Medicare HMO | Admitting: Family Medicine

## 2020-10-18 ENCOUNTER — Other Ambulatory Visit: Payer: Self-pay

## 2020-10-18 ENCOUNTER — Encounter: Payer: Self-pay | Admitting: Family Medicine

## 2020-10-18 VITALS — BP 120/58 | HR 64 | Temp 95.5°F | Resp 17 | Ht 65.0 in | Wt 189.0 lb

## 2020-10-18 DIAGNOSIS — Z0000 Encounter for general adult medical examination without abnormal findings: Secondary | ICD-10-CM | POA: Insufficient documentation

## 2020-10-18 DIAGNOSIS — J439 Emphysema, unspecified: Secondary | ICD-10-CM | POA: Diagnosis not present

## 2020-10-18 NOTE — Patient Instructions (Addendum)
  Dalton Flores , Thank you for taking time to come for your Medicare Wellness Visit. I appreciate your ongoing commitment to your health goals. Please review the followingplan we discussed and let me know if I can assist you in the future.    This is a list of the screening recommended for you and due dates:  Health Maintenance  Topic Date Due  . Tetanus Vaccine  Never done  . Pneumonia vaccines (1 of 2 - PCV13) Never done  . Flu Shot  Completed  . COVID-19 Vaccine  Completed   Consider getting an alert necklace Shingles vaccine at the pharmacy-call for pricing

## 2020-10-18 NOTE — Progress Notes (Signed)
Subjective:   Dalton Flores is a 86 y.o. male who presents for Medicare Annual/Subsequent preventive examination.  Review of Systems    Macular degeneration Hearing loss    colonoscopy-Dr. Lyndel Safe Objective:    Today's Vitals   10/18/20 1505  BP: (!) 120/58  Pulse: 64  Resp: 17  Temp: (!) 95.5 F (35.3 C)  SpO2: 95%  Weight: 189 lb (85.7 kg)  Height: 5\' 5"  (1.651 m)   Body mass index is 31.45 kg/m.  Advanced Directives 10/18/2020  Does Patient Have a Medical Advance Directive? No    Current Medications (verified) Outpatient Encounter Medications as of 10/18/2020  Medication Sig  . tamsulosin (FLOMAX) 0.4 MG CAPS capsule    No facility-administered encounter medications on file as of 10/18/2020.    Allergies (verified) Hydrochlorothiazide, Nitrofurantoin, and Penicillins   History: Past Medical History:  Diagnosis Date  . Emphysema lung ([place])   . Hearing loss   . Hypothyroid   . Macular degeneration   . Meniere disease   . Prostate cancer ([place]) 2008  . Pulmonary fibrosis ([place])    Past Surgical History:  Procedure Laterality Date  . APPENDECTOMY  1946  . BACK SURGERY  2005  . BLADDER SURGERY  12/16/2019    cystoscopy internal urethrotomy with balloon and holmium and bilateral retrograde pyelograms with dilitation of stricture.   Marland Kitchen CATARACT EXTRACTION, BILATERAL    . CHOLECYSTECTOMY  08/2016  . HERNIA REPAIR  1978  . LAMINECTOMY    . PROSTATECTOMY  2005  . TONSILLECTOMY     Family History  Problem Relation Age of Onset  . Emphysema Father   . Coronary artery disease Father   . CVA Father   . Heart disease Mother   . Brain cancer Sister   . Parkinson's disease Brother   . Heart attack Brother    Social History   Socioeconomic History  . Marital status: Married    Spouse name: Not on file  . Number of children: Not on file  . Years of education: Not on file  . Highest education level: Not on file  Occupational History  . Occupation:  retired  Tobacco Use  . Smoking status: Never Smoker  . Smokeless tobacco: Never Used  Vaping Use  . Vaping Use: Never used  Substance and Sexual Activity  . Alcohol use: No  . Drug use: No  . Sexual activity: Not on file  Other Topics Concern  . Not on file  Social History Narrative  . Not on file   Social Determinants of Health   Financial Resource Strain:   . Difficulty of Paying Living Expenses: Not on file  Food Insecurity:   . Worried About Charity fundraiser in the Last Year: Not on file  . Ran Out of Food in the Last Year: Not on file  Transportation Needs:   . Lack of Transportation (Medical): Not on file  . Lack of Transportation (Non-Medical): Not on file  Physical Activity:   . Days of Exercise per Week: Not on file  . Minutes of Exercise per Session: Not on file  Stress:   . Feeling of Stress : Not on file  Social Connections:   . Frequency of Communication with Friends and Family: Not on file  . Frequency of Social Gatherings with Friends and Family: Not on file  . Attends Religious Services: Not on file  . Active Member of Clubs or Organizations: Not on file  . Attends Archivist  Meetings: Not on file  . Marital Status: Not on file    Tobacco Counseling Counseling given: Not Answered    Hearing aids bilat-Meniere's Glasses-reading-macular degeneration-injections bilat No stairs inside house-uses a ramp, no stairs-uses cane and walker-vertigo Activities of Daily Living In your present state of health, do you have any difficulty performing the following activities: 10/18/2020 07/25/2020  Hearing? Y Y  Comment - wears hearing aids  Vision? Y Y  Comment - has macular degeneration  Difficulty concentrating or making decisions? N N  Walking or climbing stairs? Y Y  Comment - SOB due to pulmonary fibrosis  Dressing or bathing? N N  Doing errands, shopping? Y Y  Comment - due to site and [employer] and eating ? N -  Using the  Toilet? N -  In the past six months, have you accidently leaked urine? N -  Do you have problems with loss of bowel control? Y -  Managing your Medications? N -  Managing your Finances? N -  Housekeeping or managing your Housekeeping? N -  Some recent data might be hidden    Patient Care Team: CoxElnita Maxwell, MD as PCP - General (Family Medicine)   [facility] degeneration Pulmonary -sleeps with oxygen at night-no inhalers-pulmonary fibrosis-heats with wood Assessment:   This is a routine wellness examination for [place].  Hearing/Vision screen Macular degeneration-[facility]-2/22 Hearing loss-[place] checked hearing-no surgery Dietary issues and exercise activities discussed: Meals at home-exercise-walking   Depression Screen PHQ 2/9 Scores 10/18/2020 07/25/2020 02/15/2020  PHQ - 2 Score 2 0 0    Fall Risk Fall Risk  10/18/2020 07/25/2020 02/15/2020  Falls in the past year? 1 1 0  Number falls in past yr: 1 1 0  Injury with Fall? 0 0 0  Risk for fall due to : Impaired balance/gait History of fall(s);Impaired balance/gait -  Follow up - Falls evaluation completed;Falls prevention discussed Falls evaluation completed  fell at home 2 weeks ago-drives limited due to vision Any stairs in or around the home?none If so, are there any without handrails?yes Home free of loose throw rugs in walkways, pet beds, electrical cords, etc? yes Adequate lighting in your home to reduce risk of falls? yes  ASSISTIVE DEVICES UTILIZED TO PREVENT FALLS:  Life alert? no Use of a cane, walker or w/c?cane Grab bars in the bathroom? Bars in the bathroom Shower chair or bench in shower? no Elevated toilet seat or a handicapped toilet?no   Cognitive Function: memory loss   Immunizations Immunization History  Administered Date(s) Administered  . Fluad Quad(high Dose 65+) 08/23/2020  . Influenza, High Dose Seasonal PF 08/21/2017  . Influenza,inj,Quad PF,6+ Mos 09/02/2016  . Moderna  SARS-COVID-2 Vaccination 01/16/2020, 02/13/2020, 08/23/2020   Qualifies for Shingles Vaccine? Yes-wants shot but has not found one available-took COVID and flu Screening Tests Health Maintenance  Topic Date Due  . TETANUS/TDAP  Never done  . PNA vac Low Risk Adult (1 of 2 - PCV13) Never done  . INFLUENZA VACCINE  Completed  . COVID-19 Vaccine  Completed    Health Maintenance Pt unsure if given pneumonia 13/23 Health Maintenance Due  Topic Date Due  . TETANUS/TDAP  Never done  . PNA vac Low Risk Adult (1 of 2 - PCV13) Never done   Colon screen-Dr. Lyndel Safe   Additional Screening: Vision Screening: Recommended annual ophthalmology exams for early detection of glaucoma and other disorders of the eye. Macular degeneration Is the patient up to date with their  annual eye exam?  [place] Who is the provider or what is the name of the office in which the patient attends annual eye exams? Macular degeneration If pt is not established with a provider, would they like to be referred to a provider to establish care?   Dental Screening: upper dentures-no choking or difficulty shewing Plan:    1. Medicare annual wellness visit, subsequent Call [facility] for cost of shingles vaccine -available at the pharmacy. Call for discounts on med alert through [facility] I have personally reviewed and noted the following in the patient's chart:   . Medical and social history . Use of alcohol, tobacco or illicit drugs  . Current medications and supplements . Functional ability and status . Nutritional status . Physical activity . Advanced directives . List of other physicians . Hospitalizations, surgeries, and ER visits in previous 12 months . Vitals . Screenings to include cognitive, depression, and falls . Referrals and appointments  In addition, I have reviewed and discussed with patient certain preventive protocols, quality metrics, and best practice recommendations. A written personalized  care plan for preventive services as well as general preventive health recommendations were provided to patient.   Mertha Baars, MD   10/18/2020

## 2020-10-24 ENCOUNTER — Encounter: Payer: Self-pay | Admitting: Family Medicine

## 2020-10-24 NOTE — Progress Notes (Signed)
error 

## 2020-10-26 ENCOUNTER — Encounter: Payer: Self-pay | Admitting: Family Medicine

## 2020-10-27 DIAGNOSIS — J8410 Pulmonary fibrosis, unspecified: Secondary | ICD-10-CM | POA: Diagnosis not present

## 2020-11-14 DIAGNOSIS — C61 Malignant neoplasm of prostate: Secondary | ICD-10-CM | POA: Diagnosis not present

## 2020-11-14 DIAGNOSIS — N50819 Testicular pain, unspecified: Secondary | ICD-10-CM | POA: Diagnosis not present

## 2020-11-26 DIAGNOSIS — J8410 Pulmonary fibrosis, unspecified: Secondary | ICD-10-CM | POA: Diagnosis not present

## 2020-12-27 DIAGNOSIS — J8410 Pulmonary fibrosis, unspecified: Secondary | ICD-10-CM | POA: Diagnosis not present

## 2021-01-18 DIAGNOSIS — H353212 Exudative age-related macular degeneration, right eye, with inactive choroidal neovascularization: Secondary | ICD-10-CM | POA: Diagnosis not present

## 2021-01-27 DIAGNOSIS — J8410 Pulmonary fibrosis, unspecified: Secondary | ICD-10-CM | POA: Diagnosis not present

## 2021-01-29 NOTE — Progress Notes (Signed)
Subjective:  Patient ID: Dalton Flores, male    DOB: Jan 11, 1929  Age: 87 y.o. MRN: [id number]  Chief Complaint  Patient presents with  . pulmonary fibrosis    HPI IPF (idiopathic pulmonary fibrosis): Breathing is terrible. He sees Dr. Halford Flores. On oxygen at 2 L at night. Rarely requires during the day. Denies coughing a lot.   Meniere's disease of both ears: Spins all the time. Hearing is fair. Meclizine just makes him sleepy.   BPH: On flomax which helps some.   Current Outpatient Medications on File Prior to Visit  Medication Sig Dispense Refill  . tamsulosin (FLOMAX) 0.4 MG CAPS capsule      No current facility-administered medications on file prior to visit.   Past Medical History:  Diagnosis Date  . Emphysema lung ([place])   . Hearing loss   . Hypothyroid   . Macular degeneration   . Meniere disease   . Prostate cancer ([place]) 2008  . Pulmonary fibrosis ([place])    Past Surgical History:  Procedure Laterality Date  . APPENDECTOMY  1946  . BACK SURGERY  2005  . BLADDER SURGERY  12/16/2019    cystoscopy internal urethrotomy with balloon and holmium and bilateral retrograde pyelograms with dilitation of stricture.   Marland Kitchen CATARACT EXTRACTION, BILATERAL    . CHOLECYSTECTOMY  08/2016  . HERNIA REPAIR  1978  . LAMINECTOMY    . PROSTATECTOMY  2005  . TONSILLECTOMY      Family History  Problem Relation Age of Onset  . Emphysema Father   . Coronary artery disease Father   . CVA Father   . Heart disease Mother   . Brain cancer Sister   . Parkinson's disease Brother   . Heart attack Brother    Social History   Socioeconomic History  . Marital status: Married    Spouse name: Not on file  . Number of children: Not on file  . Years of education: Not on file  . Highest education level: Not on file  Occupational History  . Occupation: retired  Tobacco Use  . Smoking status: Never Smoker  . Smokeless tobacco: Never Used  Vaping Use  . Vaping Use: Never used  Substance  and Sexual Activity  . Alcohol use: No  . Drug use: No  . Sexual activity: Not on file  Other Topics Concern  . Not on file  Social History Narrative  . Not on file   Social Determinants of Health   Financial Resource Strain: Not on file  Food Insecurity: Not on file  Transportation Needs: Not on file  Physical Activity: Not on file  Stress: Not on file  Social Connections: Not on file    Review of Systems  Constitutional: Negative for chills, fatigue, fever and unexpected weight change.  HENT: Negative for congestion, ear pain, sinus pain and sore throat.   Respiratory: Positive for shortness of breath. Negative for cough.   Cardiovascular: Negative for chest pain and palpitations.  Gastrointestinal: Positive for diarrhea. Negative for abdominal pain, blood in stool, constipation, nausea and vomiting.  Endocrine: Negative for polydipsia.  Genitourinary: Negative for dysuria.  Musculoskeletal: Positive for arthralgias (left knee pain and swelling ). Negative for back pain.  Skin: Negative for rash.  Neurological: Positive for dizziness. Negative for headaches.  Psychiatric/Behavioral: Negative for dysphoric mood. The patient is not nervous/anxious.      Objective:  BP 118/68   Pulse 76   Temp (!) 97.5 F (36.4 C)   Resp 16  Ht 5\' 5"  (1.651 m)   Wt 191 lb (86.6 kg)   BMI 31.78 kg/m   BP/Weight 01/30/2021 10/18/2020 1/82/9937  Systolic BP 169 678 938  Diastolic BP 68 58 70  Wt. (Lbs) 191 189 185  BMI 31.78 31.45 31.76    Physical Exam Constitutional:      Appearance: Normal appearance.  Cardiovascular:     Rate and Rhythm: Normal rate and regular rhythm.     Pulses: Normal pulses.     Heart sounds: Normal heart sounds.  Pulmonary:     Effort: Pulmonary effort is normal.     Breath sounds: Normal breath sounds.  Abdominal:     General: Bowel sounds are normal.     Palpations: Abdomen is soft. There is no mass.     Tenderness: There is no abdominal  tenderness.  Neurological:     Mental Status: He is alert.  Psychiatric:        Mood and Affect: Mood normal.        Behavior: Behavior normal.        Thought Content: Thought content normal.     Diabetic Foot Exam - Simple   No data filed      Lab Results  Component Value Date   WBC 7.8 03/28/2017   HGB 15.8 03/28/2017   HCT 46.4 03/28/2017   PLT 229.0 03/28/2017   GLUCOSE 90 03/28/2017   ALT 10 03/28/2017   AST 15 03/28/2017   NA 139 03/28/2017   K 4.4 03/28/2017   CL 103 03/28/2017   CREATININE 1.25 03/28/2017   BUN 20 03/28/2017   CO2 31 03/28/2017      Assessment & Plan:   1. IPF (idiopathic pulmonary fibrosis) (HCC)  Recommend pt call Dr. Halford Flores to set up an appt to discuss treatment options.  2. Meniere's disease of both ears Stable. Very disabling. Very thorough work up.   3. Chronic respiratory failure with hypoxia.  Dependent on oxygen at night  4. Nocturnal hypoxemia Dependent on oxygen at night  5. Mixed conductive and sensorineural hearing loss of both ears  No treatment recommended.   6. BMI 31.0-31.9,adult  Recommend continue to work on eating healthy diet and exercise.  Follow-up: Return in about 6 months (around 07/30/2021) for with me. not fasting. Marland Kitchen  An After Visit Summary was printed and given to the patient.  Dalton Brome, MD [facility] [phone]

## 2021-01-30 ENCOUNTER — Other Ambulatory Visit: Payer: Self-pay

## 2021-01-30 ENCOUNTER — Encounter: Payer: Self-pay | Admitting: Family Medicine

## 2021-01-30 ENCOUNTER — Ambulatory Visit (INDEPENDENT_AMBULATORY_CARE_PROVIDER_SITE_OTHER): Payer: Medicare HMO | Admitting: Family Medicine

## 2021-01-30 VITALS — BP 118/68 | HR 76 | Temp 97.5°F | Resp 16 | Ht 65.0 in | Wt 191.0 lb

## 2021-01-30 DIAGNOSIS — G4734 Idiopathic sleep related nonobstructive alveolar hypoventilation: Secondary | ICD-10-CM

## 2021-01-30 DIAGNOSIS — H8103 Meniere's disease, bilateral: Secondary | ICD-10-CM

## 2021-01-30 DIAGNOSIS — J439 Emphysema, unspecified: Secondary | ICD-10-CM | POA: Diagnosis not present

## 2021-01-30 DIAGNOSIS — J84112 Idiopathic pulmonary fibrosis: Secondary | ICD-10-CM | POA: Diagnosis not present

## 2021-01-30 DIAGNOSIS — H906 Mixed conductive and sensorineural hearing loss, bilateral: Secondary | ICD-10-CM

## 2021-01-30 DIAGNOSIS — K219 Gastro-esophageal reflux disease without esophagitis: Secondary | ICD-10-CM | POA: Diagnosis not present

## 2021-01-30 DIAGNOSIS — Z6831 Body mass index (BMI) 31.0-31.9, adult: Secondary | ICD-10-CM | POA: Diagnosis not present

## 2021-01-30 DIAGNOSIS — J9611 Chronic respiratory failure with hypoxia: Secondary | ICD-10-CM | POA: Diagnosis not present

## 2021-01-30 NOTE — Patient Instructions (Signed)
Dr. Halford Chessman [address], [place], [place] [address] Call for follow up appointment to discuss pulmonary fibrosis medications. Phone: [phone]

## 2021-01-31 LAB — CBC WITH DIFFERENTIAL/PLATELET
Basophils Absolute: 0.1 10*3/uL (ref 0.0–0.2)
Basos: 1 %
EOS (ABSOLUTE): 0.3 10*3/uL (ref 0.0–0.4)
Eos: 3 %
Hematocrit: 45.7 % (ref 37.5–51.0)
Hemoglobin: 15.7 g/dL (ref 13.0–17.7)
Immature Grans (Abs): 0 10*3/uL (ref 0.0–0.1)
Immature Granulocytes: 0 %
Lymphocytes Absolute: 1.9 10*3/uL (ref 0.7–3.1)
Lymphs: 24 %
MCH: 29.7 pg (ref 26.6–33.0)
MCHC: 34.4 g/dL (ref 31.5–35.7)
MCV: 87 fL (ref 79–97)
Monocytes Absolute: 0.8 10*3/uL (ref 0.1–0.9)
Monocytes: 10 %
Neutrophils Absolute: 5 10*3/uL (ref 1.4–7.0)
Neutrophils: 62 %
Platelets: 240 10*3/uL (ref 150–450)
RBC: 5.28 x10E6/uL (ref 4.14–5.80)
RDW: 13.7 % (ref 11.6–15.4)
WBC: 8 10*3/uL (ref 3.4–10.8)

## 2021-01-31 LAB — LIPID PANEL
Chol/HDL Ratio: 5.6 ratio — ABNORMAL HIGH (ref 0.0–5.0)
Cholesterol, Total: 213 mg/dL — ABNORMAL HIGH (ref 100–199)
HDL: 38 mg/dL — ABNORMAL LOW (ref >39)
LDL Chol Calc (NIH): 141 mg/dL — ABNORMAL HIGH (ref 0–99)
Triglycerides: 186 mg/dL — ABNORMAL HIGH (ref 0–149)
VLDL Cholesterol Cal: 34 mg/dL (ref 5–40)

## 2021-01-31 LAB — COMPREHENSIVE METABOLIC PANEL
ALT: 15 IU/L (ref 0–44)
AST: 18 IU/L (ref 0–40)
Albumin/Globulin Ratio: 1.4 (ref 1.2–2.2)
Albumin: 4.1 g/dL (ref 3.6–4.6)
Alkaline Phosphatase: 119 IU/L (ref 44–121)
BUN/Creatinine Ratio: 14 (ref 10–24)
BUN: 17 mg/dL (ref 8–27)
Bilirubin Total: 0.4 mg/dL (ref 0.0–1.2)
CO2: 24 mmol/L (ref 20–29)
Calcium: 9.3 mg/dL (ref 8.6–10.2)
Chloride: 102 mmol/L (ref 96–106)
Creatinine, Ser: 1.18 mg/dL (ref 0.76–1.27)
Globulin, Total: 3 g/dL (ref 1.5–4.5)
Glucose: 96 mg/dL (ref 65–99)
Potassium: 5.5 mmol/L — ABNORMAL HIGH (ref 3.5–5.2)
Sodium: 140 mmol/L (ref 134–144)
Total Protein: 7.1 g/dL (ref 6.0–8.5)
eGFR: 59 mL/min/{1.73_m2} — ABNORMAL LOW (ref >59)

## 2021-01-31 LAB — TSH: TSH: 4.39 u[IU]/mL (ref 0.450–4.500)

## 2021-01-31 LAB — CARDIOVASCULAR RISK ASSESSMENT

## 2021-02-01 ENCOUNTER — Other Ambulatory Visit: Payer: Self-pay

## 2021-02-01 DIAGNOSIS — E875 Hyperkalemia: Secondary | ICD-10-CM

## 2021-02-01 MED ORDER — ATORVASTATIN CALCIUM 20 MG PO TABS: 20.0000 mg | ORAL_TABLET | Freq: Every day | ORAL | 1 refills | Status: DC

## 2021-02-03 ENCOUNTER — Ambulatory Visit: Payer: Medicare HMO

## 2021-02-03 DIAGNOSIS — E875 Hyperkalemia: Secondary | ICD-10-CM

## 2021-02-03 LAB — COMPREHENSIVE METABOLIC PANEL
ALT: 18 IU/L (ref 0–44)
AST: 21 IU/L (ref 0–40)
Albumin/Globulin Ratio: 1.5 (ref 1.2–2.2)
Albumin: 3.9 g/dL (ref 3.6–4.6)
Alkaline Phosphatase: 111 IU/L (ref 44–121)
BUN/Creatinine Ratio: 19 (ref 10–24)
BUN: 22 mg/dL (ref 8–27)
Bilirubin Total: 0.3 mg/dL (ref 0.0–1.2)
CO2: 24 mmol/L (ref 20–29)
Calcium: 9 mg/dL (ref 8.6–10.2)
Chloride: 102 mmol/L (ref 96–106)
Creatinine, Ser: 1.13 mg/dL (ref 0.76–1.27)
Globulin, Total: 2.6 g/dL (ref 1.5–4.5)
Glucose: 98 mg/dL (ref 65–99)
Potassium: 5 mmol/L (ref 3.5–5.2)
Sodium: 140 mmol/L (ref 134–144)
Total Protein: 6.5 g/dL (ref 6.0–8.5)
eGFR: 62 mL/min/{1.73_m2} (ref >59)

## 2021-02-24 DIAGNOSIS — J8410 Pulmonary fibrosis, unspecified: Secondary | ICD-10-CM | POA: Diagnosis not present

## 2021-03-06 ENCOUNTER — Ambulatory Visit: Payer: Medicare HMO | Admitting: Pulmonary Disease

## 2021-03-06 ENCOUNTER — Other Ambulatory Visit: Payer: Self-pay

## 2021-03-06 ENCOUNTER — Encounter: Payer: Self-pay | Admitting: Pulmonary Disease

## 2021-03-06 DIAGNOSIS — J849 Interstitial pulmonary disease, unspecified: Secondary | ICD-10-CM | POA: Diagnosis not present

## 2021-03-06 NOTE — Progress Notes (Signed)
[place]  Chief Complaint  Patient presents with  . Follow-up    Shortness of breath with activity    Constitutional:  BP 124/72 (BP Location: Left Arm, Cuff Size: Normal)   Pulse 73   Temp 97.8 F (36.6 C) (Temporal)   Ht 5\' 5"  (1.651 m)   Wt 197 lb 9.6 oz (89.6 kg)   SpO2 97% Comment: Room air  BMI 32.88 kg/m   Past Medical History:  Hypothyroidism, Macular degeneration, Prostate cancer, Meniere disease  Past Surgical History:  He  has a past surgical history that includes Cholecystectomy (08/2016); Back surgery (2005); Prostatectomy (2005); Appendectomy (1946); Hernia repair (1978); Cataract extraction, bilateral; Laminectomy; Bladder surgery (12/16/2019); and Tonsillectomy.  Brief Summary:  Dalton Flores is a 87 y.o. male with pulmonary fibrosis.      Subjective:   He is here with his wife and daughter-in-law.  He was last seen in 2019.   He breathing has been getting worse.  He gets winded with any activity.  He has occasional cough.  Not having chest pain, wheeze, sputum, or hemoptysis.  He gets ankle swelling at times.  He is using 2 liters oxygen at night.  He has trouble controlling his bowel.  He maintained his SpO2 > 92% on room air while walking two laps today.  Physical Exam:   Appearance - well kempt   ENMT - no sinus tenderness, no oral exudate, no LAN, Mallampati 3 airway, no stridor, wears dentures, decreased hearing  Respiratory - b/l crackles   CV - s1s2 regular rate and rhythm, no murmurs  Ext - mild ankle edema  Skin - no rashes  Psych - normal mood and affect   Pulmonary testing:   Serology 03/28/17 >> ANCA, ANA, SCL 70, SSA/SSB, Anti Jo, Anti CCP, RF negative  PFT 04/25/17 >> FEV1 1.90 (114%), FEV1% 78, TLC 4.20 (76%), DLCO 68%, no BD  Esophagram 06/12/17 >> Mod GERD, mild/mod esophageal dysmotility  PFT 04/30/18 >> FEV1 1.81 (91%), FEV1% 82, TLC 3.99 (65%), DLCO 57%, no  BD  Chest Imaging:   CT angio chest 03/20/17 >> b/l peripheral fibrotic changes  Cardiac Tests:   Doppler Rt leg 08/28/17 >> no DVT  Echo 08/29/17 >> EF 55 to 60%, mod TR, enlarged RV, impaired diastolic relaxation  Social History:  He  reports that he has never smoked. He has never used smokeless tobacco. He reports that he does not drink alcohol and does not use drugs.  Family History:  His family history includes Brain cancer in his sister; CVA in his father; Coronary artery disease in his father; Emphysema in his father; Heart attack in his brother; Heart disease in his mother; Parkinson's disease in his brother.    Labs:   CMP Latest Ref Rng & Units 02/03/2021 01/30/2021 03/28/2017  Glucose 65 - 99 mg/dL 98 96 90  BUN 8 - 27 mg/dL 22 17 20   Creatinine 0.76 - 1.27 mg/dL 1.13 1.18 1.25  Sodium 134 - 144 mmol/L 140 140 139  Potassium 3.5 - 5.2 mmol/L 5.0 5.5(H) 4.4  Chloride 96 - 106 mmol/L 102 102 103  CO2 20 - 29 mmol/L 24 24 31   Calcium 8.6 - 10.2 mg/dL 9.0 9.3 9.3  Total Protein 6.0 - 8.5 g/dL 6.5 7.1 7.5  Total Bilirubin 0.0 - 1.2 mg/dL 0.3 0.4 0.5  Alkaline Phos 44 - 121 IU/L 111 119 91  AST 0 - 40 IU/L 21 18 15   ALT  0 - 44 IU/L 18 15 10     CBC Latest Ref Rng & Units 01/30/2021 03/28/2017  WBC 3.4 - 10.8 x10E3/uL 8.0 7.8  Hemoglobin 13.0 - 17.7 g/dL 15.7 15.8  Hematocrit 37.5 - 51.0 % 45.7 46.4  Platelets 150 - 450 x10E3/uL 240 229.0    Assessment/Plan:   ILD with IPF pattern. - he has clinical progression - discussed natural history of IPF - discussed medications options: ofev, and esbriet; concerned would be related to GI side effects - will arrange for pulmonary function testing and high resolution CT chest - discussed considering a more conservative approach to management, but he would like to get additional testing done first before making any decisions  Nocturnal hypoxemia. - continue 2 liters oxygen at night  Time Spent Involved in Patient Care on Day of  Examination:  33 minutes  Follow up:  Patient Instructions  Will arrange for high resolution CT chest and pulmonary function test  Follow up in 6 to 8 weeks   Medication List:   Allergies as of 03/06/2021      Reactions   Hydrochlorothiazide Other (See Comments)   Dizziness   Nitrofurantoin Rash   Penicillins Rash      Medication List       Accurate as of March 06, 2021 12:54 PM. If you have any questions, ask your nurse or doctor.        atorvastatin 20 MG tablet Commonly known as: Lipitor Take 1 tablet (20 mg total) by mouth at bedtime.   tamsulosin 0.4 MG Caps capsule Commonly known as: [id number]       Signature:  Chesley Mires, MD [place] Pager - [phone] 03/06/2021, 12:54 PM

## 2021-03-06 NOTE — Addendum Note (Signed)
Addended by: Merrilee Seashore on: 03/06/2021 01:10 PM   Modules accepted: Orders

## 2021-03-06 NOTE — Patient Instructions (Addendum)
Will arrange for high resolution CT chest and pulmonary function test  Follow up in 6 to 8 weeks

## 2021-03-07 ENCOUNTER — Telehealth: Payer: Self-pay | Admitting: Pulmonary Disease

## 2021-03-07 DIAGNOSIS — J849 Interstitial pulmonary disease, unspecified: Secondary | ICD-10-CM

## 2021-03-07 NOTE — Telephone Encounter (Signed)
Called and spoke with pt's step-daughter Romie Minus who stated they were told by [facility] that an order from our office was needing to be faxed to them in order for a covid test to be done there prior to pt having the PFT. [facility] and was provided fax number [phone] that we can fax the order to.  Order has been placed and has been faxed to [facility] at the provided fax number. Nothing further needed.

## 2021-03-14 ENCOUNTER — Encounter: Payer: Self-pay | Admitting: Pulmonary Disease

## 2021-03-14 DIAGNOSIS — I2510 Atherosclerotic heart disease of native coronary artery without angina pectoris: Secondary | ICD-10-CM | POA: Diagnosis not present

## 2021-03-14 DIAGNOSIS — J479 Bronchiectasis, uncomplicated: Secondary | ICD-10-CM | POA: Diagnosis not present

## 2021-03-14 DIAGNOSIS — J849 Interstitial pulmonary disease, unspecified: Secondary | ICD-10-CM | POA: Diagnosis not present

## 2021-03-14 DIAGNOSIS — S22060A Wedge compression fracture of T7-T8 vertebra, initial encounter for closed fracture: Secondary | ICD-10-CM | POA: Diagnosis not present

## 2021-03-14 DIAGNOSIS — Z8546 Personal history of malignant neoplasm of prostate: Secondary | ICD-10-CM | POA: Diagnosis not present

## 2021-03-22 ENCOUNTER — Telehealth: Payer: Self-pay | Admitting: Pulmonary Disease

## 2021-03-22 NOTE — Telephone Encounter (Signed)
HRCT chest 03/14/21 >> widespread septal thickening, subpleural reticulation, mild cylindrical BTX, peripheral bronchiolectasis, craniocaudal gradient.  Consistent with probable UIP pattern.  Please let him know his CT chest report from [facility] reviewed and consistent with idiopathic pulmonary fibrosis.  This is what was expected to be seen.  Will discuss in more details at his next visit in May after he does PFT.

## 2021-03-23 NOTE — Telephone Encounter (Signed)
Attempted to talk to patient. I repeated myself to patient 3 times and he hung up the phone. Will try back later.

## 2021-03-24 NOTE — Telephone Encounter (Signed)
Called and spoke with Dalton Flores per DPR and went over HRCT results. She expressed understanding. Advised that Dr. Halford Chessman would go over everything at his appt in May after his PET scan. She expressed understanding. Nothing further needed at this time.

## 2021-03-27 DIAGNOSIS — J8410 Pulmonary fibrosis, unspecified: Secondary | ICD-10-CM | POA: Diagnosis not present

## 2021-04-04 DIAGNOSIS — R1031 Right lower quadrant pain: Secondary | ICD-10-CM | POA: Diagnosis not present

## 2021-04-04 DIAGNOSIS — C61 Malignant neoplasm of prostate: Secondary | ICD-10-CM | POA: Diagnosis not present

## 2021-04-04 DIAGNOSIS — N50819 Testicular pain, unspecified: Secondary | ICD-10-CM | POA: Diagnosis not present

## 2021-04-06 DIAGNOSIS — K5750 Diverticulosis of both small and large intestine without perforation or abscess without bleeding: Secondary | ICD-10-CM | POA: Diagnosis not present

## 2021-04-06 DIAGNOSIS — R1031 Right lower quadrant pain: Secondary | ICD-10-CM | POA: Diagnosis not present

## 2021-04-06 DIAGNOSIS — Z8546 Personal history of malignant neoplasm of prostate: Secondary | ICD-10-CM | POA: Diagnosis not present

## 2021-04-06 DIAGNOSIS — I700 Atherosclerosis of aorta: Secondary | ICD-10-CM | POA: Diagnosis not present

## 2021-04-06 DIAGNOSIS — N281 Cyst of kidney, acquired: Secondary | ICD-10-CM | POA: Diagnosis not present

## 2021-04-23 DIAGNOSIS — Z20828 Contact with and (suspected) exposure to other viral communicable diseases: Secondary | ICD-10-CM | POA: Diagnosis not present

## 2021-04-24 ENCOUNTER — Telehealth: Payer: Self-pay | Admitting: Pulmonary Disease

## 2021-04-24 NOTE — Telephone Encounter (Signed)
Called the Urgent care and confirmed that pt had a rapid covid test completed yesterday. Spoke with Romie Minus (per [facility]) and notified her that a PCR covid test must be completed and resulted for pt to be able to have PFTs completed in office. Romie Minus stated she would see if there was someone who could take pt to complete PCR today and if not, Romie Minus would call office and reschedule PFT. Nothing further needed at this time.

## 2021-04-26 ENCOUNTER — Ambulatory Visit: Payer: Medicare HMO | Admitting: Pulmonary Disease

## 2021-04-26 DIAGNOSIS — J8410 Pulmonary fibrosis, unspecified: Secondary | ICD-10-CM | POA: Diagnosis not present

## 2021-05-27 DIAGNOSIS — J8410 Pulmonary fibrosis, unspecified: Secondary | ICD-10-CM | POA: Diagnosis not present

## 2021-06-20 ENCOUNTER — Other Ambulatory Visit: Payer: Self-pay

## 2021-06-20 ENCOUNTER — Ambulatory Visit (INDEPENDENT_AMBULATORY_CARE_PROVIDER_SITE_OTHER): Payer: Medicare HMO | Admitting: Pulmonary Disease

## 2021-06-20 ENCOUNTER — Encounter: Payer: Self-pay | Admitting: Pulmonary Disease

## 2021-06-20 ENCOUNTER — Ambulatory Visit: Payer: Medicare HMO | Admitting: Pulmonary Disease

## 2021-06-20 VITALS — BP 118/64 | HR 70 | Temp 97.6°F | Ht 65.0 in | Wt 194.0 lb

## 2021-06-20 DIAGNOSIS — G4734 Idiopathic sleep related nonobstructive alveolar hypoventilation: Secondary | ICD-10-CM

## 2021-06-20 DIAGNOSIS — J849 Interstitial pulmonary disease, unspecified: Secondary | ICD-10-CM

## 2021-06-20 LAB — CBC
HCT: 41.8 % (ref 39.0–52.0)
Hemoglobin: 14.5 g/dL (ref 13.0–17.0)
MCHC: 34.7 g/dL (ref 30.0–36.0)
MCV: 87.2 fl (ref 78.0–100.0)
Platelets: 207 10*3/uL (ref 150.0–400.0)
RBC: 4.8 Mil/uL (ref 4.22–5.81)
RDW: 14.5 % (ref 11.5–15.5)
WBC: 7.7 10*3/uL (ref 4.0–10.5)

## 2021-06-20 LAB — PULMONARY FUNCTION TEST
DL/VA % pred: 95 %
DL/VA: 3.67 ml/min/mmHg/L
DLCO cor % pred: 58 %
DLCO cor: 11.5 ml/min/mmHg
DLCO unc % pred: 58 %
DLCO unc: 11.5 ml/min/mmHg
FEF 25-75 Post: 1.79 L/sec
FEF 25-75 Pre: 1.44 L/sec
FEF2575-%Change-Post: 24 %
FEF2575-%Pred-Post: 174 %
FEF2575-%Pred-Pre: 140 %
FEV1-%Change-Post: 5 %
FEV1-%Pred-Post: 82 %
FEV1-%Pred-Pre: 77 %
FEV1-Post: 1.5 L
FEV1-Pre: 1.42 L
FEV1FVC-%Change-Post: 0 %
FEV1FVC-%Pred-Pre: 115 %
FEV6-%Change-Post: 5 %
FEV6-%Pred-Post: 74 %
FEV6-%Pred-Pre: 70 %
FEV6-Post: 1.87 L
FEV6-Pre: 1.77 L
FEV6FVC-%Pred-Post: 110 %
FEV6FVC-%Pred-Pre: 110 %
FVC-%Change-Post: 6 %
FVC-%Pred-Post: 68 %
FVC-%Pred-Pre: 63 %
FVC-Post: 1.89 L
FVC-Pre: 1.77 L
Post FEV1/FVC ratio: 80 %
Post FEV6/FVC ratio: 100 %
Pre FEV1/FVC ratio: 80 %
Pre FEV6/FVC Ratio: 100 %
RV % pred: 51 %
RV: 1.33 L
TLC % pred: 52 %
TLC: 3.21 L

## 2021-06-20 LAB — COMPREHENSIVE METABOLIC PANEL
ALT: 14 U/L (ref 0–53)
AST: 18 U/L (ref 0–37)
Albumin: 4 g/dL (ref 3.5–5.2)
Alkaline Phosphatase: 100 U/L (ref 39–117)
BUN: 19 mg/dL (ref 6–23)
CO2: 30 mEq/L (ref 19–32)
Calcium: 9.2 mg/dL (ref 8.4–10.5)
Chloride: 102 mEq/L (ref 96–112)
Creatinine, Ser: 1.04 mg/dL (ref 0.40–1.50)
GFR: 63.42 mL/min (ref >60.00)
Glucose, Bld: 125 mg/dL — ABNORMAL HIGH (ref 70–99)
Potassium: 4.5 mEq/L (ref 3.5–5.1)
Sodium: 139 mEq/L (ref 135–145)
Total Bilirubin: 0.4 mg/dL (ref 0.2–1.2)
Total Protein: 6.7 g/dL (ref 6.0–8.3)

## 2021-06-20 NOTE — Patient Instructions (Signed)
Full PFT performed today. °

## 2021-06-20 NOTE — Addendum Note (Signed)
Addended by: Suzzanne Cloud E on: 06/20/2021 11:42 AM   Modules accepted: Orders

## 2021-06-20 NOTE — Progress Notes (Signed)
Full PFT performed today. °

## 2021-06-20 NOTE — Progress Notes (Signed)
[place] [facility], [facility], [facility] Sleep [facility]  Chief Complaint  Patient presents with   Follow-up    Patient here for PFT review and still having shortness of breath all the time.     Constitutional:  BP 118/64 (BP Location: Left Arm, Patient Position: Sitting, Cuff Size: Normal)   Pulse 70   Temp 97.6 F (36.4 C) (Oral)   Ht 5\' 5"  (1.651 m)   Wt 194 lb (88 kg)   SpO2 94%   BMI 32.28 kg/m   Past Medical History:  Hypothyroidism, Macular degeneration, Prostate cancer, Meniere disease  Past Surgical History:  He  has a past surgical history that includes Cholecystectomy (08/2016); Back surgery (2005); Prostatectomy (2005); Appendectomy (1946); Hernia repair (1978); Cataract extraction, bilateral; Laminectomy; Bladder surgery (12/16/2019); and Tonsillectomy.  Brief Summary:  Dalton Flores is a 87 y.o. male with pulmonary fibrosis.      Subjective:   He is here with his family.  He had CT chest in April that showed progressive changes of UIP pattern.  PFT today shows progression of restrictive defect.    He gets winded with minimal activity.  Intermittent dry cough.  No leg swelling.  Using oxygen at night.  Physical Exam:   Appearance - well kempt   ENMT - no sinus tenderness, no oral exudate, no LAN, Mallampati 3 airway, no stridor, wears dentures, decreased hearing  Respiratory - basilar crackles  CV - s1s2 regular rate and rhythm, no murmurs  Ext - no clubbing, no edema  Skin - no rashes  Psych - normal mood and affect   Pulmonary testing:  Serology 03/28/17 >> ANCA, ANA, SCL 70, SSA/SSB, Anti Jo, Anti CCP, RF negative PFT 04/25/17 >> FEV1 1.90 (114%), FEV1% 78, TLC 4.20 (76%), DLCO 68%, no BD Esophagram 06/12/17 >> Mod GERD, mild/mod esophageal dysmotility PFT 04/30/18 >> FEV1 1.81 (91%), FEV1% 82, TLC 3.99 (65%), DLCO 57%, no BD PFT 06/20/21 >> FEV1 1.50 (82%), FEV1% 80, TLC 3.21 (52%), DLCO 58%  Chest Imaging:  CT angio chest 03/20/17 >>  b/l peripheral fibrotic changes HRCT chest 03/14/21 >> widespread septal thickening, subpleural reticulation, mild cylindrical BTX, peripheral bronchiolectasis, craniocaudal gradient.  Consistent with probable UIP pattern.  Cardiac Tests:  Doppler Rt leg 08/28/17 >> no DVT Echo 08/29/17 >> EF 55 to 60%, mod TR, enlarged RV, impaired diastolic relaxation  Social History:  He  reports that he has never smoked. He has never used smokeless tobacco. He reports that he does not drink alcohol and does not use drugs.  Family History:  His family history includes Brain cancer in his sister; CVA in his father; Coronary artery disease in his father; Emphysema in his father; Heart attack in his brother; Heart disease in his mother; Parkinson's disease in his brother.    Labs:   CMP Latest Ref Rng & Units 02/03/2021 01/30/2021 03/28/2017  Glucose 65 - 99 mg/dL 98 96 90  BUN 8 - 27 mg/dL 22 17 20   Creatinine 0.76 - 1.27 mg/dL 1.13 1.18 1.25  Sodium 134 - 144 mmol/L 140 140 139  Potassium 3.5 - 5.2 mmol/L 5.0 5.5(H) 4.4  Chloride 96 - 106 mmol/L 102 102 103  CO2 20 - 29 mmol/L 24 24 31   Calcium 8.6 - 10.2 mg/dL 9.0 9.3 9.3  Total Protein 6.0 - 8.5 g/dL 6.5 7.1 7.5  Total Bilirubin 0.0 - 1.2 mg/dL 0.3 0.4 0.5  Alkaline Phos 44 - 121 IU/L 111 119 91  AST 0 - 40 IU/L  21 18 15   ALT 0 - 44 IU/L 18 15 10     CBC Latest Ref Rng & Units 01/30/2021 03/28/2017  WBC 3.4 - 10.8 x10E3/uL 8.0 7.8  Hemoglobin 13.0 - 17.7 g/dL 15.7 15.8  Hematocrit 37.5 - 51.0 % 45.7 46.4  Platelets 150 - 450 x10E3/uL 240 229.0    Assessment/Plan:   ILD with IPF pattern. - he has progression - discussed natural history of IPF - he is agreeable to do trial of antifibrotic medication - will repeat labs today - will start approval process for Ofev; main concern will be worsening diarrhea in setting of chronic proctitis after therapy for prostate cancer   Nocturnal hypoxemia. - continue 2 liters oxygen at night - he will check  pulse oximetry at home - goal SpO2 > 90%  Time Spent Involved in Patient Care on Day of Examination:  34 minutes  Follow up:   Patient Instructions  Lab tests today  Will start the process to get approval for Ofev  Follow up in 2 months - okay to schedule as a video visit   Medication List:   Allergies as of 06/20/2021       Reactions   Hydrochlorothiazide Other (See Comments)   Dizziness   Nitrofurantoin Rash   Penicillins Rash        Medication List        Accurate as of June 20, 2021 11:37 AM. If you have any questions, ask your nurse or doctor.          STOP taking these medications    sulfamethoxazole-trimethoprim 400-80 MG tablet Commonly known as: BACTRIM Stopped by: Chesley Mires, MD       TAKE these medications    atorvastatin 20 MG tablet Commonly known as: Lipitor Take 1 tablet (20 mg total) by mouth at bedtime.   tamsulosin 0.4 MG Caps capsule Commonly known as: XUXYBF        Signature:  Chesley Mires, MD [place] Pager - [phone] 06/20/2021, 11:37 AM

## 2021-06-20 NOTE — Patient Instructions (Signed)
Lab tests today  Will start the process to get approval for Ofev  Follow up in 2 months - okay to schedule as a video visit

## 2021-06-22 ENCOUNTER — Encounter: Payer: Self-pay | Admitting: *Deleted

## 2021-06-26 DIAGNOSIS — J8410 Pulmonary fibrosis, unspecified: Secondary | ICD-10-CM | POA: Diagnosis not present

## 2021-07-05 DIAGNOSIS — N50819 Testicular pain, unspecified: Secondary | ICD-10-CM | POA: Diagnosis not present

## 2021-07-05 DIAGNOSIS — C61 Malignant neoplasm of prostate: Secondary | ICD-10-CM | POA: Diagnosis not present

## 2021-07-05 DIAGNOSIS — R3129 Other microscopic hematuria: Secondary | ICD-10-CM | POA: Diagnosis not present

## 2021-07-21 DIAGNOSIS — H353232 Exudative age-related macular degeneration, bilateral, with inactive choroidal neovascularization: Secondary | ICD-10-CM | POA: Diagnosis not present

## 2021-07-26 ENCOUNTER — Telehealth: Payer: Self-pay | Admitting: Pulmonary Disease

## 2021-07-26 DIAGNOSIS — Z5181 Encounter for therapeutic drug level monitoring: Secondary | ICD-10-CM

## 2021-07-26 NOTE — Telephone Encounter (Signed)
Submitted a Prior Authorization request to [facility] for OFEV via CoverMyMeds. Will update once we receive a response.   Key: [id number]

## 2021-07-26 NOTE — Telephone Encounter (Signed)
Will forward to the pharmacy to see if they have any information on the ofev for this pt.  Thanks

## 2021-07-26 NOTE — Telephone Encounter (Signed)
Pharmacy never received any New Start paperwork for this patient, therefore we have no prior knowledge of this situation. We will begin benefits investigation for Ofev and follow up.

## 2021-07-26 NOTE — Telephone Encounter (Signed)
Application for [employer] patient assistance program and [employer] sent to patient's address on file. Called patient's saughter to advise - left VM.  Will also mail Ofev medication information to review.  She has been advised to complete and return to clinic with income documents. Address and pharmacy team office number noted   She requested cancelling appt with Dr. Halford Chessman in September. I cancelled appt today  Knox Saliva, PharmD, MPH, BCPS Clinical Pharmacist (Rheumatology and Pulmonology)

## 2021-07-27 DIAGNOSIS — J8410 Pulmonary fibrosis, unspecified: Secondary | ICD-10-CM | POA: Diagnosis not present

## 2021-07-27 NOTE — Telephone Encounter (Signed)
Paperwork placed in mailbox on 8/25, will await pt response. No update from [facility] at this time, will continue to f/u.

## 2021-07-31 ENCOUNTER — Other Ambulatory Visit: Payer: Self-pay

## 2021-07-31 ENCOUNTER — Ambulatory Visit (INDEPENDENT_AMBULATORY_CARE_PROVIDER_SITE_OTHER): Payer: Medicare HMO

## 2021-07-31 ENCOUNTER — Ambulatory Visit (INDEPENDENT_AMBULATORY_CARE_PROVIDER_SITE_OTHER): Payer: Medicare HMO | Admitting: Family Medicine

## 2021-07-31 ENCOUNTER — Encounter: Payer: Self-pay | Admitting: Family Medicine

## 2021-07-31 VITALS — BP 118/70 | HR 81 | Temp 95.6°F | Ht 65.0 in | Wt 194.0 lb

## 2021-07-31 DIAGNOSIS — H6123 Impacted cerumen, bilateral: Secondary | ICD-10-CM

## 2021-07-31 DIAGNOSIS — E782 Mixed hyperlipidemia: Secondary | ICD-10-CM

## 2021-07-31 DIAGNOSIS — H8103 Meniere's disease, bilateral: Secondary | ICD-10-CM | POA: Diagnosis not present

## 2021-07-31 DIAGNOSIS — J84112 Idiopathic pulmonary fibrosis: Secondary | ICD-10-CM | POA: Diagnosis not present

## 2021-07-31 DIAGNOSIS — Z23 Encounter for immunization: Secondary | ICD-10-CM

## 2021-07-31 DIAGNOSIS — Z6832 Body mass index (BMI) 32.0-32.9, adult: Secondary | ICD-10-CM

## 2021-07-31 DIAGNOSIS — E6609 Other obesity due to excess calories: Secondary | ICD-10-CM

## 2021-07-31 DIAGNOSIS — K580 Irritable bowel syndrome with diarrhea: Secondary | ICD-10-CM

## 2021-07-31 NOTE — Patient Instructions (Signed)
USE DEBROX (GENERIC OTC IS FINE) IN BOTH EARS PER OTC INSTRUCTIONS X 1 WEEK THEN COME BACK FOR WAX IRRIGATION.

## 2021-07-31 NOTE — Progress Notes (Signed)
Subjective:  Patient ID: Dalton Flores, male    DOB: Jun 07, 1929  Age: 87 y.o. MRN: [id number]  Chief Complaint  Patient presents with   Hyperlipidemia   HPI Pt has Meniere's disease. Pt saw Dr. Idelle Crouch, ENT, specialist in Meniere's disease.  No falls. No sodium diet has helped.  Hyperlipidemia- taking lipitor 20 mg, eats healthy but does not exercise regularly. BPH: on tamsulosin.  IPF: seen by Dr. Halford Chessman. Has a specialty medicine on order. Pt has DOE.   Current Outpatient Medications on File Prior to Visit  Medication Sig Dispense Refill   atorvastatin (LIPITOR) 20 MG tablet Take 1 tablet (20 mg total) by mouth at bedtime. 90 tablet 1   tamsulosin (FLOMAX) 0.4 MG CAPS capsule      No current facility-administered medications on file prior to visit.   Past Medical History:  Diagnosis Date   Emphysema lung ([place])    Hearing loss    Hypothyroid    Macular degeneration    Meniere disease    Prostate cancer ([place]) 2008   Pulmonary fibrosis [facility])    Past Surgical History:  Procedure Laterality Date   APPENDECTOMY  1946   BACK SURGERY  2005   BLADDER SURGERY  12/16/2019    cystoscopy internal urethrotomy with balloon and holmium and bilateral retrograde pyelograms with dilitation of stricture.    CATARACT EXTRACTION, BILATERAL     CHOLECYSTECTOMY  08/2016   HERNIA REPAIR  1978   LAMINECTOMY     PROSTATECTOMY  2005   TONSILLECTOMY      Family History  Problem Relation Age of Onset   Emphysema Father    Coronary artery disease Father    CVA Father    Heart disease Mother    Brain cancer Sister    Parkinson's disease Brother    Heart attack Brother    Social History   Socioeconomic History   Marital status: Married    Spouse name: Not on file   Number of children: Not on file   Years of education: Not on file   Highest education level: Not on file  Occupational History   Occupation: retired  Tobacco Use   Smoking status: Never   Smokeless tobacco: Never   Vaping Use   Vaping Use: Never used  Substance and Sexual Activity   Alcohol use: No   Drug use: No   Sexual activity: Not on file  Other Topics Concern   Not on file  Social History Narrative   Not on file   Social Determinants of Health   Financial Resource Strain: Not on file  Food Insecurity: Not on file  Transportation Needs: Not on file  Physical Activity: Not on file  Stress: Not on file  Social Connections: Not on file    Review of Systems  Constitutional:  Negative for chills, diaphoresis, fatigue and fever.  HENT:  Positive for rhinorrhea. Negative for congestion, ear pain and sore throat.   Respiratory:  Positive for shortness of breath. Negative for cough.   Cardiovascular:  Negative for chest pain and leg swelling.  Gastrointestinal:  Negative for abdominal pain, constipation, diarrhea, nausea and vomiting.       Patient experiences abrupt bowel incontinence after eating. Intermittent. It could be a month without flare. Pt has figured out certainly foods (corn, broccoli) trigger it. Prefers no medicine.  Genitourinary:  Positive for testicular pain. Negative for dysuria, penile pain and urgency.  Musculoskeletal:  Negative for arthralgias and myalgias.  Neurological:  Negative  for dizziness and headaches.  Psychiatric/Behavioral:  Negative for dysphoric mood.    GU issues (testicular pain) managed by Dr. Nila Nephew.   Objective:  BP 118/70   Pulse 81   Temp (!) 95.6 F (35.3 C)   Ht '5\' 5"'$  (1.651 m)   Wt 194 lb (88 kg)   SpO2 94%   BMI 32.28 kg/m   BP/Weight 07/31/2021 [id number]  Systolic BP 123456 [id number]  Diastolic BP 70 64 72  Wt. (Lbs) 194 194 197.6  BMI 32.28 32.28 32.88    Physical Exam Constitutional:      Appearance: Normal appearance. He is obese.  HENT:     Right Ear: There is impacted cerumen.     Left Ear: There is impacted cerumen.  Neck:     Vascular: No carotid bruit.  Cardiovascular:     Rate and Rhythm: Normal rate and  regular rhythm.     Heart sounds: Normal heart sounds.  Pulmonary:     Effort: Pulmonary effort is normal. No respiratory distress.     Breath sounds: Normal breath sounds. No wheezing, rhonchi or rales.  Abdominal:     General: Bowel sounds are normal.     Palpations: Abdomen is soft.     Tenderness: There is no abdominal tenderness.  Neurological:     Mental Status: He is alert.  Psychiatric:        Mood and Affect: Mood normal.        Behavior: Behavior normal.    Diabetic Foot Exam - Simple   No data filed      Lab Results  Component Value Date   WBC 7.7 06/20/2021   HGB 14.5 06/20/2021   HCT 41.8 06/20/2021   PLT 207.0 06/20/2021   GLUCOSE 125 (H) 06/20/2021   CHOL 213 (H) 01/30/2021   TRIG 186 (H) 01/30/2021   HDL 38 (L) 01/30/2021   LDLCALC 141 (H) 01/30/2021   ALT 14 06/20/2021   AST 18 06/20/2021   NA 139 06/20/2021   K 4.5 06/20/2021   CL 102 06/20/2021   CREATININE 1.04 06/20/2021   BUN 19 06/20/2021   CO2 30 06/20/2021   TSH 4.390 01/30/2021      Assessment & Plan:   1. Mixed hyperlipidemia Well controlled.  No changes to medicines.  Continue to work on eating a healthy diet and exercise.  Labs drawn today.  - CBC with Differential/Platelet - Comprehensive metabolic panel - Lipid panel  2. IPF (idiopathic pulmonary fibrosis) ([place]) Management per specialist.   3. Need for immunization against influenza - Flu Vaccine QUAD High Dose(Fluad)  4. Bilateral impacted cerumen Debrox x 1 week, then return for irrigation.  5. Meniere's disease of both ears No treatment.  6. Class 1 obesity due to excess calories with serious comorbidity and body mass index (BMI) of 32.0 to 32.9 in adult  Recommend continue to work on eating healthy diet and exercise.  7. IBS-D Adjust diet. Prefers no mediciens at this time   Orders Placed This Encounter  Procedures   Flu Vaccine QUAD High Dose(Fluad)   CBC with Differential/Platelet   Comprehensive  metabolic panel   Lipid panel      Follow-up: Return in about 1 week (around 08/07/2021) for [place]. Marland Kitchen  An After Visit Summary was printed and given to the patient.  Rochel Brome, MD [facility] [phone]

## 2021-08-01 LAB — COMPREHENSIVE METABOLIC PANEL
ALT: 12 IU/L (ref 0–44)
AST: 16 IU/L (ref 0–40)
Albumin/Globulin Ratio: 1.8 (ref 1.2–2.2)
Albumin: 4.1 g/dL (ref 3.5–4.6)
Alkaline Phosphatase: 134 IU/L — ABNORMAL HIGH (ref 44–121)
BUN/Creatinine Ratio: 22 (ref 10–24)
BUN: 24 mg/dL (ref 10–36)
Bilirubin Total: 0.4 mg/dL (ref 0.0–1.2)
CO2: 25 mmol/L (ref 20–29)
Calcium: 9.2 mg/dL (ref 8.6–10.2)
Chloride: 100 mmol/L (ref 96–106)
Creatinine, Ser: 1.07 mg/dL (ref 0.76–1.27)
Globulin, Total: 2.3 g/dL (ref 1.5–4.5)
Glucose: 98 mg/dL (ref 65–99)
Potassium: 4.6 mmol/L (ref 3.5–5.2)
Sodium: 140 mmol/L (ref 134–144)
Total Protein: 6.4 g/dL (ref 6.0–8.5)
eGFR: 66 mL/min/{1.73_m2} (ref >59)

## 2021-08-01 LAB — CBC WITH DIFFERENTIAL/PLATELET
Basophils Absolute: 0.1 10*3/uL (ref 0.0–0.2)
Basos: 1 %
EOS (ABSOLUTE): 0.3 10*3/uL (ref 0.0–0.4)
Eos: 4 %
Hematocrit: 42.9 % (ref 37.5–51.0)
Hemoglobin: 14.4 g/dL (ref 13.0–17.7)
Immature Grans (Abs): 0 10*3/uL (ref 0.0–0.1)
Immature Granulocytes: 0 %
Lymphocytes Absolute: 2 10*3/uL (ref 0.7–3.1)
Lymphs: 26 %
MCH: 29.4 pg (ref 26.6–33.0)
MCHC: 33.6 g/dL (ref 31.5–35.7)
MCV: 88 fL (ref 79–97)
Monocytes Absolute: 0.8 10*3/uL (ref 0.1–0.9)
Monocytes: 11 %
Neutrophils Absolute: 4.4 10*3/uL (ref 1.4–7.0)
Neutrophils: 58 %
Platelets: 217 10*3/uL (ref 150–450)
RBC: 4.89 x10E6/uL (ref 4.14–5.80)
RDW: 13.5 % (ref 11.6–15.4)
WBC: 7.5 10*3/uL (ref 3.4–10.8)

## 2021-08-01 LAB — LIPID PANEL
Chol/HDL Ratio: 4.4 ratio (ref 0.0–5.0)
Cholesterol, Total: 166 mg/dL (ref 100–199)
HDL: 38 mg/dL — ABNORMAL LOW (ref >39)
LDL Chol Calc (NIH): 104 mg/dL — ABNORMAL HIGH (ref 0–99)
Triglycerides: 135 mg/dL (ref 0–149)
VLDL Cholesterol Cal: 24 mg/dL (ref 5–40)

## 2021-08-01 LAB — CARDIOVASCULAR RISK ASSESSMENT

## 2021-08-02 NOTE — Telephone Encounter (Signed)
Multiple attempts made to request PA questions from [facility] via [place], all attempts unsuccessful. Reached out to [facility] rep and had PA form faxed to office. Filled out and submitted along with chart notes. Will await further updates.  Fax# [phone] Phone# [phone]

## 2021-08-03 ENCOUNTER — Other Ambulatory Visit (HOSPITAL_COMMUNITY): Payer: Self-pay

## 2021-08-03 NOTE — Telephone Encounter (Signed)
Received fax from [facility] stating that Dalton Flores is currently covered under patient's insurance plan. Test claim revealed that insurance does in fact cover $9,559.45, leaving patient with a copay of $3,045.60 ($1,470.11 of which is being attributed to coverage gap).   Will retain fax to send in with PAP paperwork. Provider portion has been received, will continue to await patient portion.

## 2021-08-09 ENCOUNTER — Ambulatory Visit (INDEPENDENT_AMBULATORY_CARE_PROVIDER_SITE_OTHER): Payer: Medicare HMO

## 2021-08-09 ENCOUNTER — Other Ambulatory Visit: Payer: Self-pay

## 2021-08-09 DIAGNOSIS — H6123 Impacted cerumen, bilateral: Secondary | ICD-10-CM

## 2021-08-09 NOTE — Telephone Encounter (Signed)
Spoke with patient's daughter, [name], who had further questions about the income information. She has copy of social security letter and will be mailing application back to clinic today. We reviewed cost of Ofev through insurance.  We briefly reviewed side effects but I plan to counsel in detail once approved. Daughter is concerned about starting Ofev given side effects (she reviewed information that I mailed with application to her home). We discussed that if he cannot tolerate Ofev despite different side effect management strategies, then we would likely discontinue  Knox Saliva, PharmD, MPH, BCPS Clinical Pharmacist (Rheumatology and Pulmonology)

## 2021-08-14 NOTE — Telephone Encounter (Signed)
Submitted Patient Assistance Application to [employer] for OFEV along with provider portion, patient portion, PA, insurance card copy, med list, and income documents. Will update patient when we receive a response.  Called patient's daughter, Jeannene Patella, to notify that we received application and have submitted and that we will be in touch with updates  Fax# [phone] Phone# [phone]

## 2021-08-16 ENCOUNTER — Other Ambulatory Visit (HOSPITAL_COMMUNITY): Payer: Self-pay

## 2021-08-16 DIAGNOSIS — R3129 Other microscopic hematuria: Secondary | ICD-10-CM | POA: Diagnosis not present

## 2021-08-16 DIAGNOSIS — N50819 Testicular pain, unspecified: Secondary | ICD-10-CM | POA: Diagnosis not present

## 2021-08-16 DIAGNOSIS — C61 Malignant neoplasm of prostate: Secondary | ICD-10-CM | POA: Diagnosis not present

## 2021-08-16 MED ORDER — OFEV 150 MG PO CAPS: 150.0000 mg | ORAL_CAPSULE | Freq: Two times a day (BID) | ORAL | 0 refills | Status: DC

## 2021-08-16 NOTE — Telephone Encounter (Signed)
Received notification from [facility] that application could not be processed at this time due to lack of prescription. Confirmed that directions and refills fields were both blank. Refaxing application.

## 2021-08-16 NOTE — Telephone Encounter (Signed)
Received fax from [facility] that patient may be eligible for medication through [place]. If denied by LIS then patient will be reconsidered for approval through [facility] after denial letter has been submitted. Patient is being sent a one-time supply of Ofev in the interim period.  Social Security office# [phone]

## 2021-08-16 NOTE — Telephone Encounter (Signed)
Spoke with patient's daughter Dalton Flores who states that they are expecting to receive 90 day supply of Ofev later this week. We reviewed side effects, goals of care, and hepatic monitoring for medication. We reviewed that she should get the LIS process started ASAP since turnaround time to receive physical copy of letter may take several weeks to months.   Patient counseled on purpose, proper use, and potential adverse effects including diarrhea, nausea, vomiting, abdominal pain, decreased appetite, weight loss, and increased blood pressure. Stressed the importance of routine lab monitoring. Will monitor LFT's every month for the first 6 months of treatment then every 3 months. Will monitor CBC every 3 months.  Future lab order for CMP placed today.  Ofev dose will be 150 mg capsule every 12 hours with food. Stressed importance of taking with food to minimize stomach upset.  Receiving gratuity 90 day supply will allow patient to trial medication and assess for tolerance. Order Ofev '150mg'$  twice as no print today.  I've advised Dalton Flores to reach back out to our clinic once he starts medication so he can be seen in 2 months or less by Dr. Halford Chessman. Also advised to reach out to clinic with any concerns or side effects once he starts.  Knox Saliva, PharmD, MPH, BCPS Clinical Pharmacist (Rheumatology and Pulmonology)

## 2021-08-22 ENCOUNTER — Telehealth: Payer: Medicare HMO | Admitting: Pulmonary Disease

## 2021-08-23 ENCOUNTER — Telehealth: Payer: Medicare HMO | Admitting: Pulmonary Disease

## 2021-08-27 DIAGNOSIS — J8410 Pulmonary fibrosis, unspecified: Secondary | ICD-10-CM | POA: Diagnosis not present

## 2021-09-07 NOTE — Telephone Encounter (Signed)
Spoke to daughter Jeannene Patella, she had trouble with applying for LIS online, states it kept saying mismatched social. She requested they mail them an LIS paper application. She states she does not want to try via the phone due to the extended hold times. She will keep office updated.

## 2021-09-26 DIAGNOSIS — J8410 Pulmonary fibrosis, unspecified: Secondary | ICD-10-CM | POA: Diagnosis not present

## 2021-09-29 NOTE — Telephone Encounter (Signed)
Noted  

## 2021-09-29 NOTE — Telephone Encounter (Signed)
Pam, patient's daughter, returned call and states that patient has taken 1.5 months of Ofev. Still has 1.5 months supply remaining at home  They submitted LIS application about 1-2 weeks ago.  She reports that since starting Ofev, patient has had on and off diarrhea.  Patient had 3 episodes of diarrhea yesterday and has had pain in his side since he started Ofev. Pain is sometimes bilateral and sometimes under his breastbone. But unclear if it's reflux or indigestion since it doesn't always occur after eating.  They have been advised to hold Ofev until f/u visit with Dr. Halford Chessman on 10/04/21  Knox Saliva, PharmD, MPH, BCPS Clinical Pharmacist (Rheumatology and Pulmonology)

## 2021-09-29 NOTE — Telephone Encounter (Signed)
Called patient's daughter for update on LIS application but unable to reach. Left VM requesting return call for update  Knox Saliva, PharmD, MPH, BCPS Clinical Pharmacist (Rheumatology and Pulmonology)

## 2021-10-04 ENCOUNTER — Ambulatory Visit: Payer: Medicare HMO | Admitting: Pulmonary Disease

## 2021-10-04 ENCOUNTER — Other Ambulatory Visit: Payer: Self-pay

## 2021-10-04 ENCOUNTER — Encounter: Payer: Self-pay | Admitting: Pulmonary Disease

## 2021-10-04 ENCOUNTER — Ambulatory Visit (INDEPENDENT_AMBULATORY_CARE_PROVIDER_SITE_OTHER): Payer: Medicare HMO

## 2021-10-04 DIAGNOSIS — R079 Chest pain, unspecified: Secondary | ICD-10-CM

## 2021-10-04 DIAGNOSIS — J8410 Pulmonary fibrosis, unspecified: Secondary | ICD-10-CM | POA: Diagnosis not present

## 2021-10-04 DIAGNOSIS — J84112 Idiopathic pulmonary fibrosis: Secondary | ICD-10-CM

## 2021-10-04 MED ORDER — PREDNISONE 10 MG PO TABS: ORAL_TABLET | ORAL | 0 refills | Status: AC

## 2021-10-04 MED ORDER — AZITHROMYCIN 250 MG PO TABS: ORAL_TABLET | ORAL | 0 refills | Status: AC

## 2021-10-04 NOTE — Patient Instructions (Signed)
Zithromax 250 mg pill >2 pills on day 1, then 1 pill daily for the next 4 days  Prednisone 10 mg pill > 3 pills daily for 2 days, then 2 pills daily for 2 days, then 1 pill daily for 2 days  Follow up in 4 weeks with Dr. Halford Chessman or Nurse Practitioner

## 2021-10-04 NOTE — Progress Notes (Signed)
[facility]  Chief Complaint  Patient presents with   pulmonary fibrosis    Patient was taken ofev and stopped due to diarrhea      Constitutional:  There were no vitals taken for this visit.  Past Medical History:  Hypothyroidism, Macular degeneration, Prostate cancer, Meniere disease  Past Surgical History:  He  has a past surgical history that includes Cholecystectomy (08/2016); Back surgery (2005); Prostatectomy (2005); Appendectomy (1946); Hernia repair (1978); Cataract extraction, bilateral; Laminectomy; Bladder surgery (12/16/2019); and Tonsillectomy.  Brief Summary:  Dalton Flores is a 87 y.o. male with pulmonary fibrosis.      Subjective:   He is here with his family.  He was started on Ofev in August.  Developed severe diarrhea.  This was intolerable, and he had to stop Ofev.  For the past several days he has been feeling more short of breath and has pain in his right chest.  He is not having fever, hemoptysis, or sputum.  Has been getting swelling in his testicles and has appointment with PCP scheduled to further assess.  Physical Exam:   Appearance - has to stop between sentences to catch his breath  ENMT - no sinus tenderness, no oral exudate, no LAN, Mallampati 3 airway, no stridor  Respiratory - basilar crackles, decreased BS in upper lung fields  CV - s1s2 regular rate and rhythm, no murmurs  Ext - no clubbing, no edema  Skin - no rashes  Psych - normal mood and affect  CXR xray today - b/l fibrosis, and possible right base infiltrate.  Pulmonary testing:  Serology 03/28/17 >> ANCA, ANA, SCL 70, SSA/SSB, Anti Jo, Anti CCP, RF negative PFT 04/25/17 >> FEV1 1.90 (114%), FEV1% 78, TLC 4.20 (76%), DLCO 68%, no BD Esophagram 06/12/17 >> Mod GERD, mild/mod esophageal dysmotility PFT 04/30/18 >> FEV1 1.81 (91%), FEV1% 82, TLC 3.99 (65%), DLCO 57%, no BD PFT 06/20/21 >> FEV1 1.50 (82%), FEV1% 80, TLC 3.21  (52%), DLCO 58%  Chest Imaging:  CT angio chest 03/20/17 >> b/l peripheral fibrotic changes HRCT chest 03/14/21 >> widespread septal thickening, subpleural reticulation, mild cylindrical BTX, peripheral bronchiolectasis, craniocaudal gradient.  Consistent with probable UIP pattern.  Cardiac Tests:  Doppler Rt leg 08/28/17 >> no DVT Echo 08/29/17 >> EF 55 to 60%, mod TR, enlarged RV, impaired diastolic relaxation  Social History:  He  reports that he has never smoked. He has never used smokeless tobacco. He reports that he does not drink alcohol and does not use drugs.  Family History:  His family history includes Brain cancer in his sister; CVA in his father; Coronary artery disease in his father; Emphysema in his father; Heart attack in his brother; Heart disease in his mother; Parkinson's disease in his brother.     Assessment/Plan:   ILD with IPF pattern. - Ofev discontinued due to diarrhea; he would not want to consider trial of esbriet - has increased dyspnea and right sided chest pain - will give course of prednisone and zithromax   Nocturnal hypoxemia. - continue 2 liters oxygen at night - he will check pulse oximetry at home - goal SpO2 > 90% - will need to assess O2 needs with exertion at his next follow up   Time Spent Involved in Patient Care on Day of Examination:  42 minutes  Follow up:   There are no Patient Instructions on file for this visit.  Medication List:   Allergies as of 10/04/2021  Reactions   Hydrochlorothiazide Other (See Comments)   Dizziness   Nitrofurantoin Rash   Penicillins Rash        Medication List        Accurate as of October 04, 2021  2:15 PM. If you have any questions, ask your nurse or doctor.          atorvastatin 20 MG tablet Commonly known as: Lipitor Take 1 tablet (20 mg total) by mouth at bedtime.   Ofev 150 MG Caps Generic drug: Nintedanib Take 1 capsule (150 mg total) by mouth 2 (two) times daily.  Gratuity 90 day supply received from manufacturer by patient while he applies for low-income subsidy   tamsulosin 0.4 MG Caps capsule Commonly known as: OIZTIW        Signature:  Chesley Mires, MD [place] Pager - [phone] 10/04/2021, 2:15 PM

## 2021-10-23 ENCOUNTER — Other Ambulatory Visit: Payer: Self-pay

## 2021-10-23 ENCOUNTER — Ambulatory Visit (INDEPENDENT_AMBULATORY_CARE_PROVIDER_SITE_OTHER): Payer: Medicare HMO

## 2021-10-23 VITALS — BP 118/60 | HR 74 | Ht 65.0 in | Wt 193.8 lb

## 2021-10-23 DIAGNOSIS — Z0000 Encounter for general adult medical examination without abnormal findings: Secondary | ICD-10-CM

## 2021-11-07 ENCOUNTER — Other Ambulatory Visit: Payer: Self-pay

## 2021-11-07 ENCOUNTER — Ambulatory Visit (INDEPENDENT_AMBULATORY_CARE_PROVIDER_SITE_OTHER): Payer: Medicare HMO | Admitting: Family Medicine

## 2021-11-07 ENCOUNTER — Encounter: Payer: Self-pay | Admitting: Family Medicine

## 2021-11-07 VITALS — BP 110/74 | HR 72 | Temp 97.4°F | Resp 18 | Ht 65.0 in | Wt 192.4 lb

## 2021-11-07 DIAGNOSIS — Z23 Encounter for immunization: Secondary | ICD-10-CM | POA: Diagnosis not present

## 2021-11-07 DIAGNOSIS — E782 Mixed hyperlipidemia: Secondary | ICD-10-CM

## 2021-11-07 DIAGNOSIS — J84112 Idiopathic pulmonary fibrosis: Secondary | ICD-10-CM

## 2021-11-07 DIAGNOSIS — G4734 Idiopathic sleep related nonobstructive alveolar hypoventilation: Secondary | ICD-10-CM

## 2021-11-07 DIAGNOSIS — H8103 Meniere's disease, bilateral: Secondary | ICD-10-CM

## 2021-11-07 NOTE — Progress Notes (Signed)
Subjective:  Patient ID: Dalton Flores, male    DOB: [birth year]  Age: 87 y.o. MRN: [id number]  Chief Complaint  Patient presents with   Hyperlipidemia     HPI Meniere's disease: Flares up, but never goes completely away. Medicines do not help.  Patient has seen ENT previously and they have nothing left to offer him.  I added ENT to his care team.  Pulmonary fibrosis. Has been seeing Dr Halford Chessman, but due to distance would like to see Dr. Alcide Clever. Intolerant to ofev.  Patient currently uses 2 L of oxygen at night.  Hyperlipidemia: Patient takes Lipitor for brief period of time.  His cholesterol did improve.  He discontinued it over the last 3 months.  He does not want to take any cholesterol medicines.  He did not have side effects, but just does not feel he needs them or wants them.   Diet: low salt, low fat.  Exercise: He walks and cuts wood. He mows.   Current Outpatient Medications on File Prior to Visit  Medication Sig Dispense Refill   Multiple Vitamins-Minerals (PRESERVISION AREDS PO) Take by mouth.     tamsulosin (FLOMAX) 0.4 MG CAPS capsule      No current facility-administered medications on file prior to visit.   Past Medical History:  Diagnosis Date   Emphysema lung ([place])    Hearing loss    Hypothyroid    Macular degeneration    Meniere disease    Prostate cancer ([place]) 2008   Pulmonary fibrosis [facility])    Past Surgical History:  Procedure Laterality Date   APPENDECTOMY  1946   BACK SURGERY  2005   BLADDER SURGERY  12/16/2019    cystoscopy internal urethrotomy with balloon and holmium and bilateral retrograde pyelograms with dilitation of stricture.    CATARACT EXTRACTION, BILATERAL     CHOLECYSTECTOMY  08/2016   HERNIA REPAIR  1978   LAMINECTOMY     PROSTATECTOMY  2005   TONSILLECTOMY      Family History  Problem Relation Age of Onset   Heart disease Mother    Emphysema Father    Coronary artery disease Father    CVA Father    Brain cancer Sister     Cancer Sister    Diabetes Sister    Diabetes Sister    Parkinson's disease Brother    Heart attack Brother    COPD Brother    Heart attack Brother    Parkinson's disease Brother    Diabetes Brother    Social History   Socioeconomic History   Marital status: Married    Spouse name: Not on file   Number of children: Not on file   Years of education: Not on file   Highest education level: Not on file  Occupational History   Occupation: retired  Tobacco Use   Smoking status: Never   Smokeless tobacco: Never  Vaping Use   Vaping Use: Never used  Substance and Sexual Activity   Alcohol use: No   Drug use: No   Sexual activity: Not on file  Other Topics Concern   Not on file  Social History Narrative   Not on file   Social Determinants of Health   Financial Resource Strain: Not on file  Food Insecurity: Not on file  Transportation Needs: Not on file  Physical Activity: Not on file  Stress: Not on file  Social Connections: Not on file    Review of Systems  Constitutional:  Negative for chills  and fever.  HENT:  Positive for congestion. Negative for rhinorrhea and sore throat.   Eyes:  Positive for visual disturbance.  Respiratory:  Positive for shortness of breath. Negative for cough.   Cardiovascular:  Negative for chest pain and palpitations.  Gastrointestinal:  Negative for abdominal pain, constipation, diarrhea, nausea and vomiting.  Genitourinary:  Negative for dysuria and urgency.  Musculoskeletal:  Negative for arthralgias, back pain and myalgias.  Neurological:  Positive for dizziness and headaches.  Psychiatric/Behavioral:  Negative for dysphoric mood. The patient is not nervous/anxious.     Objective:  BP 110/74   Pulse 72   Temp (!) 97.4 F (36.3 C)   Resp 18   Ht 5\' 5"  (1.651 m)   Wt 192 lb 6.4 oz (87.3 kg)   BMI 32.02 kg/m   BP/Weight 11/07/2021 07/31/2021 1/61/0960  Systolic BP 454 098 119  Diastolic BP 74 70 64  Wt. (Lbs) 192.4 194 194  BMI  32.02 32.28 32.28    Physical Exam Vitals reviewed.  Constitutional:      Appearance: Normal appearance.  Neck:     Vascular: No carotid bruit.  Cardiovascular:     Rate and Rhythm: Normal rate and regular rhythm.     Heart sounds: Normal heart sounds.  Pulmonary:     Effort: Pulmonary effort is normal. No respiratory distress.     Breath sounds: Normal breath sounds. No wheezing, rhonchi or rales.     Comments: Patient becomes dyspneic with walking and with speaking. Abdominal:     General: Bowel sounds are normal.     Palpations: Abdomen is soft.     Tenderness: There is no abdominal tenderness.  Neurological:     Mental Status: He is alert.  Psychiatric:        Mood and Affect: Mood normal.        Behavior: Behavior normal.    Diabetic Foot Exam - Simple   No data filed      Lab Results  Component Value Date   WBC 7.5 07/31/2021   HGB 14.4 07/31/2021   HCT 42.9 07/31/2021   PLT 217 07/31/2021   GLUCOSE 98 07/31/2021   CHOL 166 07/31/2021   TRIG 135 07/31/2021   HDL 38 (L) 07/31/2021   LDLCALC 104 (H) 07/31/2021   ALT 12 07/31/2021   AST 16 07/31/2021   NA 140 07/31/2021   K 4.6 07/31/2021   CL 100 07/31/2021   CREATININE 1.07 07/31/2021   BUN 24 07/31/2021   CO2 25 07/31/2021   TSH 4.390 01/30/2021      Assessment & Plan:   Problem List Items Addressed This Visit       Respiratory   IPF (idiopathic pulmonary fibrosis) ([place]) - Primary    Refer to Dr. Alcide Clever for further management.  Previously taken care of by Dr. Halford Chessman, but the distance is too far for him to go.      Relevant Orders   Ambulatory referral to Pulmonology   Pneumococcal conjugate vaccine 20-valent (Prevnar 20)   Nocturnal hypoxemia    Continue oxygen at night 2 L.        Nervous and Auditory   Meniere's disease    This is the largest issue with his quality of life.  Unfortunately, we do not have anything else to offer him.      Other Visit Diagnoses     Need for  pneumococcal vaccination       Relevant Orders   Pneumococcal conjugate vaccine  20-valent (Prevnar 20)   Mixed hyperlipidemia       Relevant Orders   CBC with Differential/Platelet   Comprehensive metabolic panel   Lipid panel     .  No orders of the defined types were placed in this encounter.   Orders Placed This Encounter  Procedures   Pneumococcal conjugate vaccine 20-valent (Prevnar 20)   CBC with Differential/Platelet   Comprehensive metabolic panel   Lipid panel   Ambulatory referral to Pulmonology   Total time spent on today's visit was greater than 30 minutes, including both face-to-face time and nonface-to-face time personally spent on review of chart (labs and imaging), discussing labs and goals, discussing further work-up, treatment options, referrals to specialist if needed, reviewing outside records of pertinent, answering patient's questions, and coordinating care.   Follow-up: Return in about 6 months (around 05/08/2022) for chronic follow up, awv due before the end of the year. Marland Kitchen  An After Visit Summary was printed and given to the patient.  Rochel Brome, MD [facility] [phone]

## 2021-11-07 NOTE — Assessment & Plan Note (Signed)
Refer to Dr. Alcide Clever for further management.  Previously taken care of by Dr. Halford Chessman, but the distance is too far for him to go.

## 2021-11-07 NOTE — Assessment & Plan Note (Signed)
Continue oxygen at night 2 L.

## 2021-11-07 NOTE — Assessment & Plan Note (Signed)
This is the largest issue with his quality of life.  Unfortunately, we do not have anything else to offer him.

## 2021-11-08 LAB — COMPREHENSIVE METABOLIC PANEL
ALT: 15 IU/L (ref 0–44)
AST: 21 IU/L (ref 0–40)
Albumin/Globulin Ratio: 1.6 (ref 1.2–2.2)
Albumin: 4.1 g/dL (ref 3.5–4.6)
Alkaline Phosphatase: 135 IU/L — ABNORMAL HIGH (ref 44–121)
BUN/Creatinine Ratio: 11 (ref 10–24)
BUN: 11 mg/dL (ref 10–36)
Bilirubin Total: 0.4 mg/dL (ref 0.0–1.2)
CO2: 27 mmol/L (ref 20–29)
Calcium: 9 mg/dL (ref 8.6–10.2)
Chloride: 102 mmol/L (ref 96–106)
Creatinine, Ser: 1.03 mg/dL (ref 0.76–1.27)
Globulin, Total: 2.5 g/dL (ref 1.5–4.5)
Glucose: 101 mg/dL — ABNORMAL HIGH (ref 70–99)
Potassium: 4.9 mmol/L (ref 3.5–5.2)
Sodium: 142 mmol/L (ref 134–144)
Total Protein: 6.6 g/dL (ref 6.0–8.5)
eGFR: 69 mL/min/{1.73_m2} (ref >59)

## 2021-11-08 LAB — CBC WITH DIFFERENTIAL/PLATELET
Basophils Absolute: 0.1 10*3/uL (ref 0.0–0.2)
Basos: 1 %
EOS (ABSOLUTE): 0.3 10*3/uL (ref 0.0–0.4)
Eos: 4 %
Hematocrit: 45.2 % (ref 37.5–51.0)
Hemoglobin: 15.6 g/dL (ref 13.0–17.7)
Immature Grans (Abs): 0 10*3/uL (ref 0.0–0.1)
Immature Granulocytes: 0 %
Lymphocytes Absolute: 2 10*3/uL (ref 0.7–3.1)
Lymphs: 25 %
MCH: 30.2 pg (ref 26.6–33.0)
MCHC: 34.5 g/dL (ref 31.5–35.7)
MCV: 87 fL (ref 79–97)
Monocytes Absolute: 0.9 10*3/uL (ref 0.1–0.9)
Monocytes: 11 %
Neutrophils Absolute: 4.8 10*3/uL (ref 1.4–7.0)
Neutrophils: 59 %
Platelets: 271 10*3/uL (ref 150–450)
RBC: 5.17 x10E6/uL (ref 4.14–5.80)
RDW: 13.9 % (ref 11.6–15.4)
WBC: 8.1 10*3/uL (ref 3.4–10.8)

## 2021-11-08 LAB — LIPID PANEL
Chol/HDL Ratio: 6.5 ratio — ABNORMAL HIGH (ref 0.0–5.0)
Cholesterol, Total: 194 mg/dL (ref 100–199)
HDL: 30 mg/dL — ABNORMAL LOW (ref >39)
LDL Chol Calc (NIH): 123 mg/dL — ABNORMAL HIGH (ref 0–99)
Triglycerides: 233 mg/dL — ABNORMAL HIGH (ref 0–149)
VLDL Cholesterol Cal: 41 mg/dL — ABNORMAL HIGH (ref 5–40)

## 2021-11-08 LAB — CARDIOVASCULAR RISK ASSESSMENT

## 2021-11-08 NOTE — Progress Notes (Signed)
Blood count normal.  Liver function normal.  Kidney function normal.  Cholesterol: is very high. Low fat diet. Recommend try otc fish oil 1 gm 2 capsules twice a day.

## 2021-11-10 ENCOUNTER — Telehealth: Payer: Self-pay

## 2021-11-10 NOTE — Telephone Encounter (Signed)
Recommend ibuprofen up to 800 mg one three times a day and/or tylenol (as directed on bottle.) If still hurting next week, I would recommend he call for an appointment. kc

## 2021-11-10 NOTE — Telephone Encounter (Signed)
Pam, daughter, calling for information of medication recommended after lab results. Pam called pt on another line for pt to give authorization for office to speak with Elberta Leatherwood gave verbal authorization over phone to speak with Pam. Recommendations given, Pam VU.   While on phone, pt mentioned he is having pain under R arm pit. He did not sleep all night due to pain. He is requesting pain medication for this.   Royce Macadamia, [place] 11/10/21 10:38 AM

## 2021-11-10 NOTE — Telephone Encounter (Signed)
Spoke w/ [place], wife. She VU.   Royce Macadamia, [place] 11/10/21 12:21 PM

## 2021-11-16 NOTE — Progress Notes (Signed)
Subjective:   Dalton Flores is a 87 y.o. male who presents for Medicare Annual/Subsequent preventive examination.  This wellness visit is conducted by a nurse.  The patient's medications were reviewed and reconciled since the patient's last visit.  History details were provided by the patient and his daughter.  The history appears to be reliable.    Patient's last AWV was one year ago.   Medical History: Patient history and Family history was reviewed  Medications, Allergies, and preventative health maintenance was reviewed and updated.  Review of Systems    ROS-Negative Cardiac Risk Factors include: advanced age (>67men, >15 women);dyslipidemia;obesity (BMI >30kg/m2)     Objective:    Today's Vitals   10/23/21 1400  BP: 118/60  Pulse: 74  SpO2: 96%  Weight: 193 lb 12.8 oz (87.9 kg)  Height: 5\' 5"  (1.651 m)  PainSc: 0-No pain   Body mass index is 32.25 kg/m.  Advanced Directives 11/16/2021 10/18/2020  Does Patient Have a Medical Advance Directive? No No  Would patient like information on creating a medical advance directive? No - Patient declined -    Current Medications (verified) Outpatient Encounter Medications as of 10/23/2021  Medication Sig   Multiple Vitamins-Minerals (PRESERVISION AREDS PO) Take by mouth.   tamsulosin (FLOMAX) 0.4 MG CAPS capsule    [DISCONTINUED] atorvastatin (LIPITOR) 20 MG tablet Take 1 tablet (20 mg total) by mouth at bedtime.   No facility-administered encounter medications on file as of 10/23/2021.    Allergies (verified) Hydrochlorothiazide, Nitrofurantoin, and Penicillins   History: Past Medical History:  Diagnosis Date   Emphysema lung ([place])    Hearing loss    Hypothyroid    Macular degeneration    Meniere disease    Prostate cancer ([place]) 2008   Pulmonary fibrosis ([place])    Past Surgical History:  Procedure Laterality Date   APPENDECTOMY  1946   BACK SURGERY  2005   BLADDER SURGERY  12/16/2019    cystoscopy  internal urethrotomy with balloon and holmium and bilateral retrograde pyelograms with dilitation of stricture.    CATARACT EXTRACTION, BILATERAL     CHOLECYSTECTOMY  08/2016   HERNIA REPAIR  1978   LAMINECTOMY     PROSTATECTOMY  2005   TONSILLECTOMY     Family History  Problem Relation Age of Onset   Heart disease Mother    Emphysema Father    Coronary artery disease Father    CVA Father    Brain cancer Sister    Cancer Sister    Diabetes Sister    Diabetes Sister    Parkinson's disease Brother    Heart attack Brother    COPD Brother    Heart attack Brother    Parkinson's disease Brother    Diabetes Brother    Social History   Socioeconomic History   Marital status: Married  Occupational History   Occupation: retired  Tobacco Use   Smoking status: Never   Smokeless tobacco: Never  Scientific laboratory technician Use: Never used  Substance and Sexual Activity   Alcohol use: No   Drug use: No   Sexual activity: Not on file   Social Determinants of Health   Financial Resource Strain: Not on file  Food Insecurity: Not on file  Transportation Needs: Not on file  Physical Activity: Not on file  Stress: Not on file  Social Connections: Not on file    Tobacco Counseling Counseling given: Patient does not use tobacco products   Clinical Intake:  Pre-visit preparation completed: Yes  Pain : No/denies pain Pain Score: 0-No pain   BMI - recorded: 32 Nutritional Status: BMI > 30  Obese Nutritional Risks: None Diabetes: No How often do you need to have someone help you when you read instructions, pamphlets, or other written materials from your doctor or pharmacy?: 2 - Rarely Interpreter Needed?: No    Activities of Daily Living In your present state of health, do you have any difficulty performing the following activities: 11/16/2021  Hearing? Y  Vision? N  Difficulty concentrating or making decisions? N  Walking or climbing stairs? Y  Dressing or bathing? N  Doing  errands, shopping? Y  Preparing Food and eating ? N  Using the Toilet? N  In the past six months, have you accidently leaked urine? N  Do you have problems with loss of bowel control? N  Managing your Medications? N  Managing your Finances? N  Housekeeping or managing your Housekeeping? N  Some recent data might be hidden    Patient Care Team: Rochel Brome, MD as PCP - General (Family Medicine) Joie Bimler, MD as Consulting Physician (Urology) Roanna Raider, MD as Referring Physician (Otolaryngology) Chesley Mires, MD as Consulting Physician (Pulmonary Disease)    Assessment:   This is a routine wellness examination for [place].  Hearing/Vision screen No results found.  Dietary issues and exercise activities discussed: Current Exercise Habits: The patient does not participate in regular exercise at present   Depression Screen PHQ 2/9 Scores 11/16/2021 11/07/2021 07/31/2021 01/30/2021 10/18/2020 07/25/2020 02/15/2020  PHQ - 2 Score 0 0 0 0 2 0 0    Fall Risk Fall Risk  11/16/2021 11/07/2021 07/31/2021 01/30/2021 10/18/2020  Falls in the past year? 1 1 1 1 1   Number falls in past yr: 1 0 1 1 1   Injury with Fall? 0 0 0 1 0  Risk for fall due to : History of fall(s);Impaired balance/gait - History of fall(s) - Impaired balance/gait  Follow up Falls evaluation completed;Falls prevention discussed Falls evaluation completed Falls evaluation completed - -  Cognitive Function:     6CIT Screen 11/16/2021 10/18/2020  What Year? 0 points 0 points  What month? 0 points 0 points  What time? 0 points 0 points  Count back from 20 0 points 0 points  Months in reverse 0 points 0 points  Repeat phrase 0 points 0 points  Total Score 0 0    Immunizations Immunization History  Administered Date(s) Administered   Fluad Quad(high Dose 65+) 08/23/2020, 07/31/2021   Influenza, High Dose Seasonal PF 08/21/2017   Influenza,inj,Quad PF,6+ Mos 09/02/2016   Moderna SARS-COV2  Booster Vaccination 07/31/2021   Moderna Sars-Covid-2 Vaccination 01/16/2020, 02/13/2020, 08/23/2020   PNEUMOCOCCAL CONJUGATE-20 11/07/2021    TDAP status: Due, Education has been provided regarding the importance of this vaccine. Advised may receive this vaccine at local pharmacy or Health Dept. Aware to provide a copy of the vaccination record if obtained from local pharmacy or Health Dept. Verbalized acceptance and understanding.  Flu Vaccine status: Up to date  Pneumococcal vaccine status: Up to date  Covid-19 vaccine status: Information provided on how to obtain vaccines.   Qualifies for Shingles Vaccine? Yes   Zostavax completed No   Shingrix Completed?: No.    Education has been provided regarding the importance of this vaccine. Patient has been advised to call insurance company to determine out of pocket expense if they have not yet received this vaccine. Advised may also receive vaccine  at local pharmacy or Health Dept. Verbalized acceptance and understanding.  Screening Tests Health Maintenance  Topic Date Due   TETANUS/TDAP  Never done   Zoster Vaccines- Shingrix (1 of 2) Never done   COVID-19 Vaccine (4 - Booster for Moderna series) 09/25/2021   Pneumonia Vaccine 71+ Years old  Completed   INFLUENZA VACCINE  Completed   HPV VACCINES  Aged Out   COLONOSCOPY (Pts 45-53yrs Insurance coverage will need to be confirmed)  Discontinued    Health Maintenance  Health Maintenance Due  Topic Date Due   TETANUS/TDAP  Never done   Zoster Vaccines- Shingrix (1 of 2) Never done   COVID-19 Vaccine (4 - Booster for Moderna series) 09/25/2021    Colorectal cancer screening: No longer required.   Lung Cancer Screening: (Low Dose CT Chest recommended if Age 21-80 years, 30 pack-year currently smoking OR have quit w/in 15years.) does not qualify.   Additional Screening:  Vision Screening: Recommended annual ophthalmology exams for early detection of glaucoma and other disorders of  the eye. Is the patient up to date with their annual eye exam?  Yes  Who is the provider or what is the name of the office in which the patient attends annual eye exams? [facility] Screening: Recommended annual dental exams for proper oral hygiene    Plan:    1- Shingles and Tetanus vaccine available at pharmacy 2- Patient uses oxygen 2 LPM QHS (supplied by [facility])  I have personally reviewed and noted the following in the patients chart:   Medical and social history Use of alcohol, tobacco or illicit drugs  Current medications and supplements including opioid prescriptions. Patient is not currently taking opioid prescriptions. Functional ability and status Nutritional status Physical activity Advanced directives List of other physicians Hospitalizations, surgeries, and ER visits in previous 12 months Vitals Screenings to include cognitive, depression, and falls Referrals and appointments  In addition, I have reviewed and discussed with patient certain preventive protocols, quality metrics, and best practice recommendations. A written personalized care plan for preventive services as well as general preventive health recommendations were provided to patient.     Erie Noe, LPN   30/08/2329

## 2021-11-16 NOTE — Patient Instructions (Signed)
Fall Prevention in the Home, Adult Falls can cause injuries and can happen to people of all ages. There are many things you can do to make your home safe and to help prevent falls. Ask for help when making these changes. What actions can I take to prevent falls? General Instructions Use good lighting in all rooms. Replace any light bulbs that burn out. Turn on the lights in dark areas. Use night-lights. Keep items that you use often in easy-to-reach places. Lower the shelves around your home if needed. Set up your furniture so you have a clear path. Avoid moving your furniture around. Do not have throw rugs or other things on the floor that can make you trip. Avoid walking on wet floors. If any of your floors are uneven, fix them. Add color or contrast paint or tape to clearly mark and help you see: Grab bars or handrails. First and last steps of staircases. Where the edge of each step is. If you use a stepladder: Make sure that it is fully opened. Do not climb a closed stepladder. Make sure the sides of the stepladder are locked in place. Ask someone to hold the stepladder while you use it. Know where your pets are when moving through your home. What can I do in the bathroom?   Keep the floor dry. Clean up any water on the floor right away. Remove soap buildup in the tub or shower. Use nonskid mats or decals on the floor of the tub or shower. Attach bath mats securely with double-sided, nonslip rug tape. If you need to sit down in the shower, use a plastic, nonslip stool. Install grab bars by the toilet and in the tub and shower. Do not use towel bars as grab bars. What can I do in the bedroom? Make sure that you have a light by your bed that is easy to reach. Do not use any sheets or blankets for your bed that hang to the floor. Have a firm chair with side arms that you can use for support when you get dressed. What can I do in the kitchen? Clean up any spills right away. If you  need to reach something above you, use a step stool with a grab bar. Keep electrical cords out of the way. Do not use floor polish or wax that makes floors slippery. What can I do with my stairs? Do not leave any items on the stairs. Make sure that you have a light switch at the top and the bottom of the stairs. Make sure that there are handrails on both sides of the stairs. Fix handrails that are broken or loose. Install nonslip stair treads on all your stairs. Avoid having throw rugs at the top or bottom of the stairs. Choose a carpet that does not hide the edge of the steps on the stairs. Check carpeting to make sure that it is firmly attached to the stairs. Fix carpet that is loose or worn. What can I do on the outside of my home? Use bright outdoor lighting. Fix the edges of walkways and driveways and fix any cracks. Remove anything that might make you trip as you walk through a door, such as a raised step or threshold. Trim any bushes or trees on paths to your home. Check to see if handrails are loose or broken and that both sides of all steps have handrails. Install guardrails along the edges of any raised decks and porches. Clear paths of anything that can  make you trip, such as tools or rocks. Have leaves, snow, or ice cleared regularly. Use sand or salt on paths during winter. Clean up any spills in your garage right away. This includes grease or oil spills. What other actions can I take? Wear shoes that: Have a low heel. Do not wear high heels. Have rubber bottoms. Feel good on your feet and fit well. Are closed at the toe. Do not wear open-toe sandals. Use tools that help you move around if needed. These include: Canes. Walkers. Scooters. Crutches. Review your medicines with your doctor. Some medicines can make you feel dizzy. This can increase your chance of falling. Ask your doctor what else you can do to help prevent falls. Where to find more information Centers for  Disease Control and Prevention, [employer]: http://www.wolf.info/ National Institute on Aging: http://kim-miller.com/ Contact a doctor if: You are afraid of falling at home. You feel weak, drowsy, or dizzy at home. You fall at home. Summary There are many simple things that you can do to make your home safe and to help prevent falls. Ways to make your home safe include removing things that can make you trip and installing grab bars in the bathroom. Ask for help when making these changes in your home. This information is not intended to replace advice given to you by your health care provider. Make sure you discuss any questions you have with your health care provider. Document Revised: 06/22/2020 Document Reviewed: 06/22/2020 Elsevier Patient Education  [place] Maintenance, Male Adopting a healthy lifestyle and getting preventive care are important in promoting health and wellness. Ask your health care provider about: The right schedule for you to have regular tests and exams. Things you can do on your own to prevent diseases and keep yourself healthy. What should I know about diet, weight, and exercise? Eat a healthy diet  Eat a diet that includes plenty of vegetables, fruits, low-fat dairy products, and lean protein. Do not eat a lot of foods that are high in solid fats, added sugars, or sodium. Maintain a healthy weight Body mass index (BMI) is a measurement that can be used to identify possible weight problems. It estimates body fat based on height and weight. Your health care provider can help determine your BMI and help you achieve or maintain a healthy weight. Get regular exercise Get regular exercise. This is one of the most important things you can do for your health. Most adults should: Exercise for at least 150 minutes each week. The exercise should increase your heart rate and make you sweat (moderate-intensity exercise). Do strengthening exercises at least twice a week. This  is in addition to the moderate-intensity exercise. Spend less time sitting. Even light physical activity can be beneficial. Watch cholesterol and blood lipids Have your blood tested for lipids and cholesterol at 85 years of age, then have this test every 5 years. You may need to have your cholesterol levels checked more often if: Your lipid or cholesterol levels are high. You are older than 85 years of age. You are at high risk for heart disease. What should I know about cancer screening? Many types of cancers can be detected early and may often be prevented. Depending on your health history and family history, you may need to have cancer screening at various ages. This may include screening for: Colorectal cancer. Prostate cancer. Skin cancer. Lung cancer. What should I know about heart disease, diabetes, and high blood pressure? Blood pressure and  heart disease High blood pressure causes heart disease and increases the risk of stroke. This is more likely to develop in people who have high blood pressure readings or are overweight. Talk with your health care provider about your target blood pressure readings. Have your blood pressure checked: Every 3-5 years if you are 44-50 years of age. Every year if you are 77 years old or older. If you are between the ages of 4 and 66 and are a current or former smoker, ask your health care provider if you should have a one-time screening for abdominal aortic aneurysm (AAA). Diabetes Have regular diabetes screenings. This checks your fasting blood sugar level. Have the screening done: Once every three years after age 67 if you are at a normal weight and have a low risk for diabetes. More often and at a younger age if you are overweight or have a high risk for diabetes. What should I know about preventing infection? Hepatitis B If you have a higher risk for hepatitis B, you should be screened for this virus. Talk with your health care provider to  find out if you are at risk for hepatitis B infection. Hepatitis C Blood testing is recommended for: Everyone born from [birth year] through 1963. Anyone with known risk factors for hepatitis C. Sexually transmitted infections (STIs) You should be screened each year for STIs, including gonorrhea and chlamydia, if: You are sexually active and are younger than 85 years of age. You are older than 85 years of age and your health care provider tells you that you are at risk for this type of infection. Your sexual activity has changed since you were last screened, and you are at increased risk for chlamydia or gonorrhea. Ask your health care provider if you are at risk. Ask your health care provider about whether you are at high risk for HIV. Your health care provider may recommend a prescription medicine to help prevent HIV infection. If you choose to take medicine to prevent HIV, you should first get tested for HIV. You should then be tested every 3 months for as long as you are taking the medicine. Follow these instructions at home: Alcohol use Do not drink alcohol if your health care provider tells you not to drink. If you drink alcohol: Limit how much you have to 0-2 drinks a day. Know how much alcohol is in your drink. In the [place] one drink equals one 12 oz bottle of beer (355 mL), one 5 oz glass of wine (148 mL), or one 1 oz glass of hard liquor (44 mL). Lifestyle Do not use any products that contain nicotine or tobacco. These products include cigarettes, chewing tobacco, and vaping devices, such as e-cigarettes. If you need help quitting, ask your health care provider. Do not use street drugs. Do not share needles. Ask your health care provider for help if you need support or information about quitting drugs. General instructions Schedule regular health, dental, and eye exams. Stay current with your vaccines. Tell your health care provider if: You often feel depressed. You have ever been  abused or do not feel safe at home. Summary Adopting a healthy lifestyle and getting preventive care are important in promoting health and wellness. Follow your health care provider's instructions about healthy diet, exercising, and getting tested or screened for diseases. Follow your health care provider's instructions on monitoring your cholesterol and blood pressure. This information is not intended to replace advice given to you by your health care provider. Make  sure you discuss any questions you have with your health care provider. Document Revised: 04/10/2021 Document Reviewed: 04/10/2021 Elsevier Patient Education  [place].

## 2021-11-22 DIAGNOSIS — J84112 Idiopathic pulmonary fibrosis: Secondary | ICD-10-CM | POA: Diagnosis not present

## 2021-11-22 DIAGNOSIS — J9611 Chronic respiratory failure with hypoxia: Secondary | ICD-10-CM | POA: Diagnosis not present

## 2021-12-07 DIAGNOSIS — L821 Other seborrheic keratosis: Secondary | ICD-10-CM | POA: Diagnosis not present

## 2021-12-07 DIAGNOSIS — L578 Other skin changes due to chronic exposure to nonionizing radiation: Secondary | ICD-10-CM | POA: Diagnosis not present

## 2021-12-07 DIAGNOSIS — L820 Inflamed seborrheic keratosis: Secondary | ICD-10-CM | POA: Diagnosis not present

## 2021-12-18 DIAGNOSIS — R051 Acute cough: Secondary | ICD-10-CM | POA: Diagnosis not present

## 2021-12-18 DIAGNOSIS — J9611 Chronic respiratory failure with hypoxia: Secondary | ICD-10-CM | POA: Diagnosis not present

## 2021-12-18 DIAGNOSIS — R0600 Dyspnea, unspecified: Secondary | ICD-10-CM | POA: Diagnosis not present

## 2021-12-18 DIAGNOSIS — J84112 Idiopathic pulmonary fibrosis: Secondary | ICD-10-CM | POA: Diagnosis not present

## 2021-12-20 DIAGNOSIS — J9611 Chronic respiratory failure with hypoxia: Secondary | ICD-10-CM | POA: Diagnosis not present

## 2021-12-20 DIAGNOSIS — J84112 Idiopathic pulmonary fibrosis: Secondary | ICD-10-CM | POA: Diagnosis not present

## 2022-01-02 DIAGNOSIS — J9611 Chronic respiratory failure with hypoxia: Secondary | ICD-10-CM | POA: Diagnosis not present

## 2022-01-02 DIAGNOSIS — J84112 Idiopathic pulmonary fibrosis: Secondary | ICD-10-CM | POA: Diagnosis not present

## 2022-01-09 DIAGNOSIS — J9611 Chronic respiratory failure with hypoxia: Secondary | ICD-10-CM | POA: Diagnosis not present

## 2022-01-09 DIAGNOSIS — J84112 Idiopathic pulmonary fibrosis: Secondary | ICD-10-CM | POA: Diagnosis not present

## 2022-01-26 DIAGNOSIS — H353232 Exudative age-related macular degeneration, bilateral, with inactive choroidal neovascularization: Secondary | ICD-10-CM | POA: Diagnosis not present

## 2022-02-06 DIAGNOSIS — J9611 Chronic respiratory failure with hypoxia: Secondary | ICD-10-CM | POA: Diagnosis not present

## 2022-02-06 DIAGNOSIS — J84112 Idiopathic pulmonary fibrosis: Secondary | ICD-10-CM | POA: Diagnosis not present

## 2022-02-19 DIAGNOSIS — N529 Male erectile dysfunction, unspecified: Secondary | ICD-10-CM | POA: Diagnosis not present

## 2022-02-19 DIAGNOSIS — C61 Malignant neoplasm of prostate: Secondary | ICD-10-CM | POA: Diagnosis not present

## 2022-02-19 DIAGNOSIS — N3289 Other specified disorders of bladder: Secondary | ICD-10-CM | POA: Diagnosis not present

## 2022-02-28 DIAGNOSIS — J84112 Idiopathic pulmonary fibrosis: Secondary | ICD-10-CM | POA: Diagnosis not present

## 2022-02-28 DIAGNOSIS — J9611 Chronic respiratory failure with hypoxia: Secondary | ICD-10-CM | POA: Diagnosis not present

## 2022-03-09 DIAGNOSIS — J84112 Idiopathic pulmonary fibrosis: Secondary | ICD-10-CM | POA: Diagnosis not present

## 2022-03-09 DIAGNOSIS — J9611 Chronic respiratory failure with hypoxia: Secondary | ICD-10-CM | POA: Diagnosis not present

## 2022-03-23 DIAGNOSIS — C61 Malignant neoplasm of prostate: Secondary | ICD-10-CM | POA: Diagnosis not present

## 2022-03-23 DIAGNOSIS — N3289 Other specified disorders of bladder: Secondary | ICD-10-CM | POA: Diagnosis not present

## 2022-03-23 DIAGNOSIS — N451 Epididymitis: Secondary | ICD-10-CM | POA: Diagnosis not present

## 2022-04-08 DIAGNOSIS — J84112 Idiopathic pulmonary fibrosis: Secondary | ICD-10-CM | POA: Diagnosis not present

## 2022-04-08 DIAGNOSIS — J9611 Chronic respiratory failure with hypoxia: Secondary | ICD-10-CM | POA: Diagnosis not present

## 2022-04-17 DIAGNOSIS — C61 Malignant neoplasm of prostate: Secondary | ICD-10-CM | POA: Diagnosis not present

## 2022-04-17 DIAGNOSIS — N3289 Other specified disorders of bladder: Secondary | ICD-10-CM | POA: Diagnosis not present

## 2022-04-17 DIAGNOSIS — N451 Epididymitis: Secondary | ICD-10-CM | POA: Diagnosis not present

## 2022-05-09 ENCOUNTER — Ambulatory Visit (INDEPENDENT_AMBULATORY_CARE_PROVIDER_SITE_OTHER): Payer: Medicare HMO | Admitting: Family Medicine

## 2022-05-09 ENCOUNTER — Encounter: Payer: Self-pay | Admitting: Family Medicine

## 2022-05-09 VITALS — BP 118/60 | HR 75 | Temp 96.5°F | Ht 65.0 in | Wt 186.0 lb

## 2022-05-09 DIAGNOSIS — Z6830 Body mass index (BMI) 30.0-30.9, adult: Secondary | ICD-10-CM | POA: Diagnosis not present

## 2022-05-09 DIAGNOSIS — E782 Mixed hyperlipidemia: Secondary | ICD-10-CM | POA: Diagnosis not present

## 2022-05-09 DIAGNOSIS — J84112 Idiopathic pulmonary fibrosis: Secondary | ICD-10-CM

## 2022-05-09 DIAGNOSIS — J9611 Chronic respiratory failure with hypoxia: Secondary | ICD-10-CM | POA: Diagnosis not present

## 2022-05-09 DIAGNOSIS — R7301 Impaired fasting glucose: Secondary | ICD-10-CM | POA: Diagnosis not present

## 2022-05-09 DIAGNOSIS — E6609 Other obesity due to excess calories: Secondary | ICD-10-CM | POA: Diagnosis not present

## 2022-05-09 DIAGNOSIS — K219 Gastro-esophageal reflux disease without esophagitis: Secondary | ICD-10-CM

## 2022-05-09 DIAGNOSIS — R946 Abnormal results of thyroid function studies: Secondary | ICD-10-CM | POA: Diagnosis not present

## 2022-05-09 DIAGNOSIS — H8103 Meniere's disease, bilateral: Secondary | ICD-10-CM | POA: Diagnosis not present

## 2022-05-09 NOTE — Progress Notes (Signed)
Subjective:  Patient ID: Dalton Flores, male    DOB: [birth year]  Age: 88 y.o. MRN: [id number]  Chief Complaint  Patient presents with   IP Fibrosis   Hyperlipidemia   HPI: Meniere's disease: ENT, Dr Idelle Crouch, ENT. No other treatments to offer. Dizzy. Not driving.   Pulmonary fibrosis: Has changed to Dr. Alcide Clever. Intolerant to ofev (diarrhea).  Patient currently uses 2 L of oxygen at night and with exertion. On Esbriet since March. Tolerating pretty well   Hyperlipidemia: Patient had a trial on Lipitor for 3 months and did very well.  It decreased his cholesterol however he came back and did not want to take that cholesterol medicine.  He did agree to fish oil which she has been taking.  Diet: appetite not on diet.   Exercise: Unable to do much exertional. Can still use his riding mower.   HPI reviewed and up to date.  Current Outpatient Medications on File Prior to Visit  Medication Sig Dispense Refill   Multiple Vitamins-Minerals (PRESERVISION AREDS PO) Take by mouth.     Pirfenidone 267 MG CAPS Take 3 capsules by mouth 3 (three) times daily.     tamsulosin (FLOMAX) 0.4 MG CAPS capsule      No current facility-administered medications on file prior to visit.   Past Medical History:  Diagnosis Date   Emphysema lung ([place])    Hearing loss    Hypothyroid    Macular degeneration    Meniere disease    Prostate cancer ([place]) 2008   Pulmonary fibrosis [facility])    Past Surgical History:  Procedure Laterality Date   APPENDECTOMY  1946   BACK SURGERY  2005   BLADDER SURGERY  12/16/2019    cystoscopy internal urethrotomy with balloon and holmium and bilateral retrograde pyelograms with dilitation of stricture.    CATARACT EXTRACTION, BILATERAL     CHOLECYSTECTOMY  08/2016   HERNIA REPAIR  1978   LAMINECTOMY     PROSTATECTOMY  2005   TONSILLECTOMY      Family History  Problem Relation Age of Onset   Heart disease Mother    Emphysema Father    Coronary artery disease  Father    CVA Father    Brain cancer Sister    Cancer Sister    Diabetes Sister    Diabetes Sister    Parkinson's disease Brother    Heart attack Brother    COPD Brother    Heart attack Brother    Parkinson's disease Brother    Diabetes Brother    Social History   Socioeconomic History   Marital status: Married    Spouse name: Not on file   Number of children: Not on file   Years of education: Not on file   Highest education level: Not on file  Occupational History   Occupation: retired  Tobacco Use   Smoking status: Never   Smokeless tobacco: Never  Vaping Use   Vaping Use: Never used  Substance and Sexual Activity   Alcohol use: No   Drug use: No   Sexual activity: Not on file  Other Topics Concern   Not on file  Social History Narrative   Not on file   Social Determinants of Health   Financial Resource Strain: Not on file  Food Insecurity: Not on file  Transportation Needs: Not on file  Physical Activity: Not on file  Stress: Not on file  Social Connections: Not on file    Review of Systems  Constitutional:  Negative for chills, fatigue and fever.  HENT:  Negative for congestion, ear pain and sore throat.   Respiratory:  Positive for shortness of breath. Negative for cough.   Cardiovascular:  Negative for chest pain.  Gastrointestinal:  Positive for diarrhea. Negative for abdominal pain, constipation, nausea and vomiting.  Genitourinary:  Negative for dysuria and frequency.  Musculoskeletal:  Negative for arthralgias and myalgias.  Neurological:  Positive for dizziness. Negative for headaches.  Psychiatric/Behavioral:  Negative for dysphoric mood.        No dysphoria       Objective:  BP 118/60   Pulse 75   Temp (!) 96.5 F (35.8 C)   Ht '5\' 5"'$  (1.651 m)   Wt 186 lb (84.4 kg)   SpO2 98%   BMI 30.95 kg/m      05/09/2022    8:08 AM 11/07/2021    8:57 AM 10/23/2021    2:00 PM  BP/Weight  Systolic BP 267 124 580  Diastolic BP 60 74 60  Wt.  (Lbs) 186 192.4 193.8  BMI 30.95 kg/m2 32.02 kg/m2 32.25 kg/m2    Physical Exam Vitals reviewed.  Constitutional:      Appearance: Normal appearance.  Cardiovascular:     Rate and Rhythm: Normal rate and regular rhythm.     Heart sounds: Normal heart sounds.  Pulmonary:     Effort: Pulmonary effort is normal.     Breath sounds: Normal breath sounds.  Abdominal:     Palpations: Abdomen is soft.     Tenderness: There is no abdominal tenderness.  Neurological:     Mental Status: He is alert and oriented to person, place, and time.  Psychiatric:        Mood and Affect: Mood normal.        Behavior: Behavior normal.     Diabetic Foot Exam - Simple   No data filed      Lab Results  Component Value Date   WBC 7.4 05/09/2022   HGB 15.1 05/09/2022   HCT 43.9 05/09/2022   PLT 250 05/09/2022   GLUCOSE 104 (H) 05/09/2022   CHOL 203 (H) 05/09/2022   TRIG 174 (H) 05/09/2022   HDL 35 (L) 05/09/2022   LDLCALC 137 (H) 05/09/2022   ALT 12 05/09/2022   AST 14 05/09/2022   NA 142 05/09/2022   K 4.6 05/09/2022   CL 102 05/09/2022   CREATININE 1.10 05/09/2022   BUN 15 05/09/2022   CO2 26 05/09/2022   TSH 4.730 (H) 05/09/2022      Assessment & Plan:   Problem List Items Addressed This Visit       Respiratory   IPF (idiopathic pulmonary fibrosis) (HCC)    Stable. The current medical regimen is somewhat effective;  continue present plan and medications.       Relevant Medications   Pirfenidone 267 MG CAPS     Nervous and Auditory   Meniere's disease    No other treatments.        Other   Mixed hyperlipidemia - Primary    Poorly controlled.  Refuses statins.  Continue to work on eating a healthy diet. Labs drawn today.        Relevant Orders   CBC with Differential/Platelet (Completed)   Comprehensive metabolic panel (Completed)   Lipid panel (Completed)   TSH (Completed)   Class 1 obesity due to excess calories with body mass index (BMI) of 30.0 to 30.9  in adult  Comorbidities: hyperlipidemia.  Recommend continue to work on eating healthy diet.     .  No orders of the defined types were placed in this encounter.   Orders Placed This Encounter  Procedures   CBC with Differential/Platelet   Comprehensive metabolic panel   Lipid panel   TSH   Cardiovascular Risk Assessment    Total time spent on today's visit was greater than 30 minutes, including both face-to-face time and nonface-to-face time personally spent on review of chart (labs and imaging), discussing labs and goals, discussing further work-up, treatment options, referrals to specialist if needed, reviewing outside records of pertinent, answering patient's questions, and coordinating care.  Follow-up: Return in about 6 months (around 11/08/2022) for awv, chronic fasting, 40 minutes please.  An After Visit Summary was printed and given to the patient.  Rochel Brome, MD [facility] [phone]

## 2022-05-10 LAB — LIPID PANEL
Chol/HDL Ratio: 5.8 ratio — ABNORMAL HIGH (ref 0.0–5.0)
Cholesterol, Total: 203 mg/dL — ABNORMAL HIGH (ref 100–199)
HDL: 35 mg/dL — ABNORMAL LOW (ref >39)
LDL Chol Calc (NIH): 137 mg/dL — ABNORMAL HIGH (ref 0–99)
Triglycerides: 174 mg/dL — ABNORMAL HIGH (ref 0–149)
VLDL Cholesterol Cal: 31 mg/dL (ref 5–40)

## 2022-05-10 LAB — CBC WITH DIFFERENTIAL/PLATELET
Basophils Absolute: 0.1 10*3/uL (ref 0.0–0.2)
Basos: 1 %
EOS (ABSOLUTE): 0.2 10*3/uL (ref 0.0–0.4)
Eos: 3 %
Hematocrit: 43.9 % (ref 37.5–51.0)
Hemoglobin: 15.1 g/dL (ref 13.0–17.7)
Immature Grans (Abs): 0 10*3/uL (ref 0.0–0.1)
Immature Granulocytes: 0 %
Lymphocytes Absolute: 2 10*3/uL (ref 0.7–3.1)
Lymphs: 27 %
MCH: 30.6 pg (ref 26.6–33.0)
MCHC: 34.4 g/dL (ref 31.5–35.7)
MCV: 89 fL (ref 79–97)
Monocytes Absolute: 0.7 10*3/uL (ref 0.1–0.9)
Monocytes: 9 %
Neutrophils Absolute: 4.4 10*3/uL (ref 1.4–7.0)
Neutrophils: 60 %
Platelets: 250 10*3/uL (ref 150–450)
RBC: 4.93 x10E6/uL (ref 4.14–5.80)
RDW: 13.7 % (ref 11.6–15.4)
WBC: 7.4 10*3/uL (ref 3.4–10.8)

## 2022-05-10 LAB — COMPREHENSIVE METABOLIC PANEL
ALT: 12 IU/L (ref 0–44)
AST: 14 IU/L (ref 0–40)
Albumin/Globulin Ratio: 1.6 (ref 1.2–2.2)
Albumin: 4.1 g/dL (ref 3.5–4.6)
Alkaline Phosphatase: 129 IU/L — ABNORMAL HIGH (ref 44–121)
BUN/Creatinine Ratio: 14 (ref 10–24)
BUN: 15 mg/dL (ref 10–36)
Bilirubin Total: 0.3 mg/dL (ref 0.0–1.2)
CO2: 26 mmol/L (ref 20–29)
Calcium: 9.1 mg/dL (ref 8.6–10.2)
Chloride: 102 mmol/L (ref 96–106)
Creatinine, Ser: 1.1 mg/dL (ref 0.76–1.27)
Globulin, Total: 2.5 g/dL (ref 1.5–4.5)
Glucose: 104 mg/dL — ABNORMAL HIGH (ref 70–99)
Potassium: 4.6 mmol/L (ref 3.5–5.2)
Sodium: 142 mmol/L (ref 134–144)
Total Protein: 6.6 g/dL (ref 6.0–8.5)
eGFR: 64 mL/min/{1.73_m2} (ref >59)

## 2022-05-10 LAB — CARDIOVASCULAR RISK ASSESSMENT

## 2022-05-10 LAB — TSH: TSH: 4.73 u[IU]/mL — ABNORMAL HIGH (ref 0.450–4.500)

## 2022-05-10 NOTE — Progress Notes (Signed)
Blood count normal.  Liver function normal.  Kidney function normal.  Thyroid function abnormal. Add on Free T4 Cholesterol: trigs improved, but still high. Hdl improved. LDL 137. Recommend zetia if will not take crestor.  Sugar little up. Add on a1c.

## 2022-05-11 ENCOUNTER — Other Ambulatory Visit: Payer: Self-pay

## 2022-05-11 DIAGNOSIS — E782 Mixed hyperlipidemia: Secondary | ICD-10-CM

## 2022-05-11 MED ORDER — EZETIMIBE 10 MG PO TABS: 10.0000 mg | ORAL_TABLET | Freq: Every day | ORAL | 3 refills | Status: DC

## 2022-05-13 DIAGNOSIS — E782 Mixed hyperlipidemia: Secondary | ICD-10-CM | POA: Insufficient documentation

## 2022-05-13 DIAGNOSIS — E6609 Other obesity due to excess calories: Secondary | ICD-10-CM | POA: Insufficient documentation

## 2022-05-13 NOTE — Assessment & Plan Note (Signed)
Poorly controlled.  Refuses statins.  Continue to work on eating a healthy diet. Labs drawn today.

## 2022-05-13 NOTE — Assessment & Plan Note (Signed)
No other treatments.

## 2022-05-13 NOTE — Assessment & Plan Note (Signed)
Comorbidities: hyperlipidemia.  Recommend continue to work on eating healthy diet. 

## 2022-05-13 NOTE — Assessment & Plan Note (Signed)
Stable. The current medical regimen is somewhat effective;  continue present plan and medications.

## 2022-05-16 DIAGNOSIS — J9611 Chronic respiratory failure with hypoxia: Secondary | ICD-10-CM | POA: Diagnosis not present

## 2022-05-16 DIAGNOSIS — J84112 Idiopathic pulmonary fibrosis: Secondary | ICD-10-CM | POA: Diagnosis not present

## 2022-05-24 ENCOUNTER — Ambulatory Visit: Payer: Medicare HMO

## 2022-05-28 ENCOUNTER — Ambulatory Visit (INDEPENDENT_AMBULATORY_CARE_PROVIDER_SITE_OTHER): Payer: Medicare HMO

## 2022-05-28 DIAGNOSIS — H6123 Impacted cerumen, bilateral: Secondary | ICD-10-CM | POA: Diagnosis not present

## 2022-06-02 LAB — SPECIMEN STATUS REPORT

## 2022-06-02 LAB — HGB A1C W/O EAG: Hgb A1c MFr Bld: 5.3 % (ref 4.8–5.6)

## 2022-06-02 LAB — T4, FREE: Free T4: 0.96 ng/dL (ref 0.82–1.77)

## 2022-06-08 DIAGNOSIS — J9611 Chronic respiratory failure with hypoxia: Secondary | ICD-10-CM | POA: Diagnosis not present

## 2022-06-08 DIAGNOSIS — J84112 Idiopathic pulmonary fibrosis: Secondary | ICD-10-CM | POA: Diagnosis not present

## 2022-08-18 DIAGNOSIS — R0689 Other abnormalities of breathing: Secondary | ICD-10-CM | POA: Diagnosis not present

## 2022-08-18 DIAGNOSIS — R1111 Vomiting without nausea: Secondary | ICD-10-CM | POA: Diagnosis not present

## 2022-08-18 DIAGNOSIS — I700 Atherosclerosis of aorta: Secondary | ICD-10-CM | POA: Diagnosis not present

## 2022-08-18 DIAGNOSIS — R9431 Abnormal electrocardiogram [ECG] [EKG]: Secondary | ICD-10-CM | POA: Diagnosis not present

## 2022-08-18 DIAGNOSIS — R531 Weakness: Secondary | ICD-10-CM | POA: Diagnosis not present

## 2022-08-18 DIAGNOSIS — R112 Nausea with vomiting, unspecified: Secondary | ICD-10-CM | POA: Diagnosis not present

## 2022-08-18 DIAGNOSIS — R61 Generalized hyperhidrosis: Secondary | ICD-10-CM | POA: Diagnosis not present

## 2022-08-18 DIAGNOSIS — N281 Cyst of kidney, acquired: Secondary | ICD-10-CM | POA: Diagnosis not present

## 2022-08-18 DIAGNOSIS — K5730 Diverticulosis of large intestine without perforation or abscess without bleeding: Secondary | ICD-10-CM | POA: Diagnosis not present

## 2022-08-18 DIAGNOSIS — R42 Dizziness and giddiness: Secondary | ICD-10-CM | POA: Diagnosis not present

## 2022-08-27 IMAGING — DX DG CHEST 2V
2 series · 2 of 2 positions shown · non-contrast
Comparison: 02/28/2018

CLINICAL DATA: Idiopathic pulmonary fibrosis, RIGHT-side chest pain

EXAM:
CHEST - 2 VIEW

[chest pa]
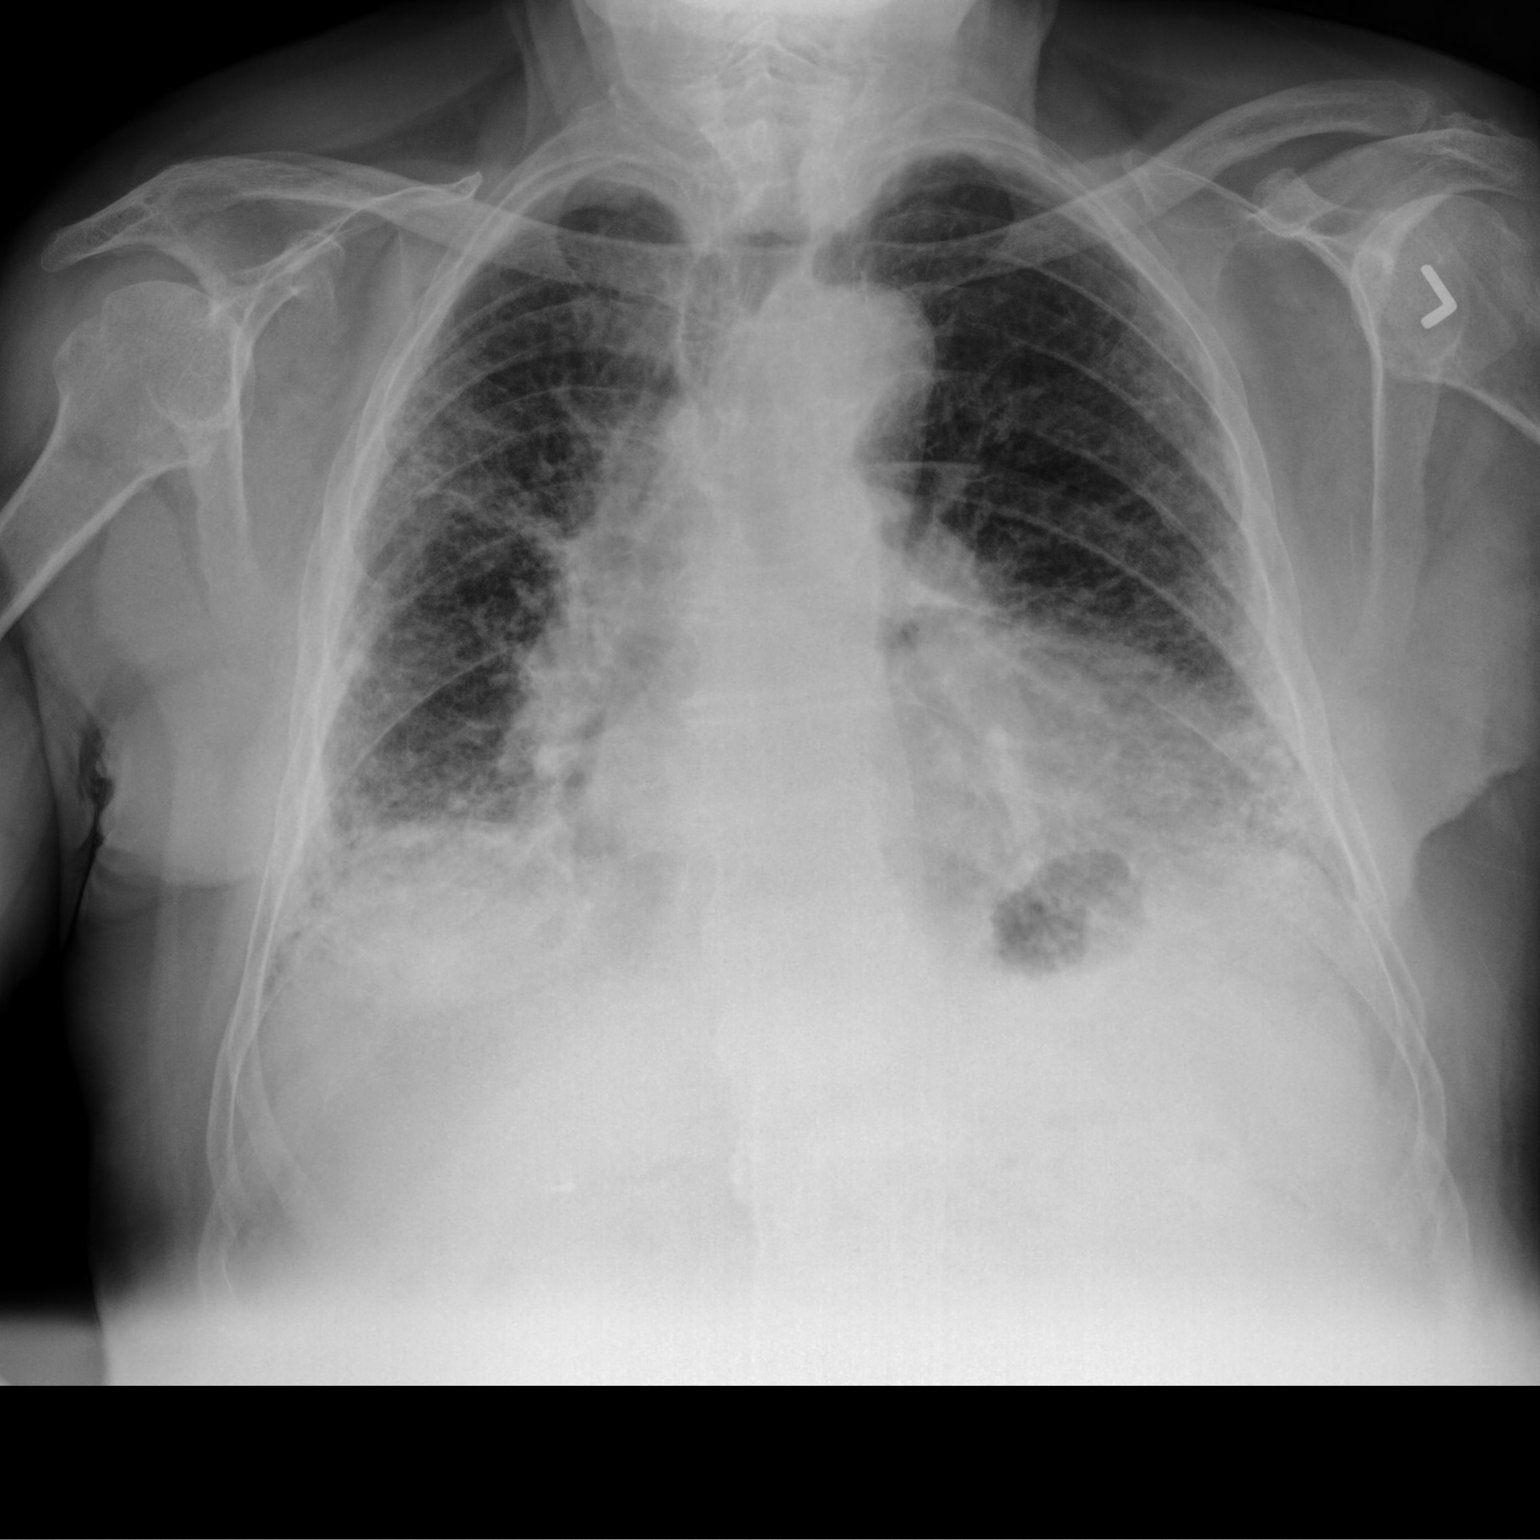

[chest lat]
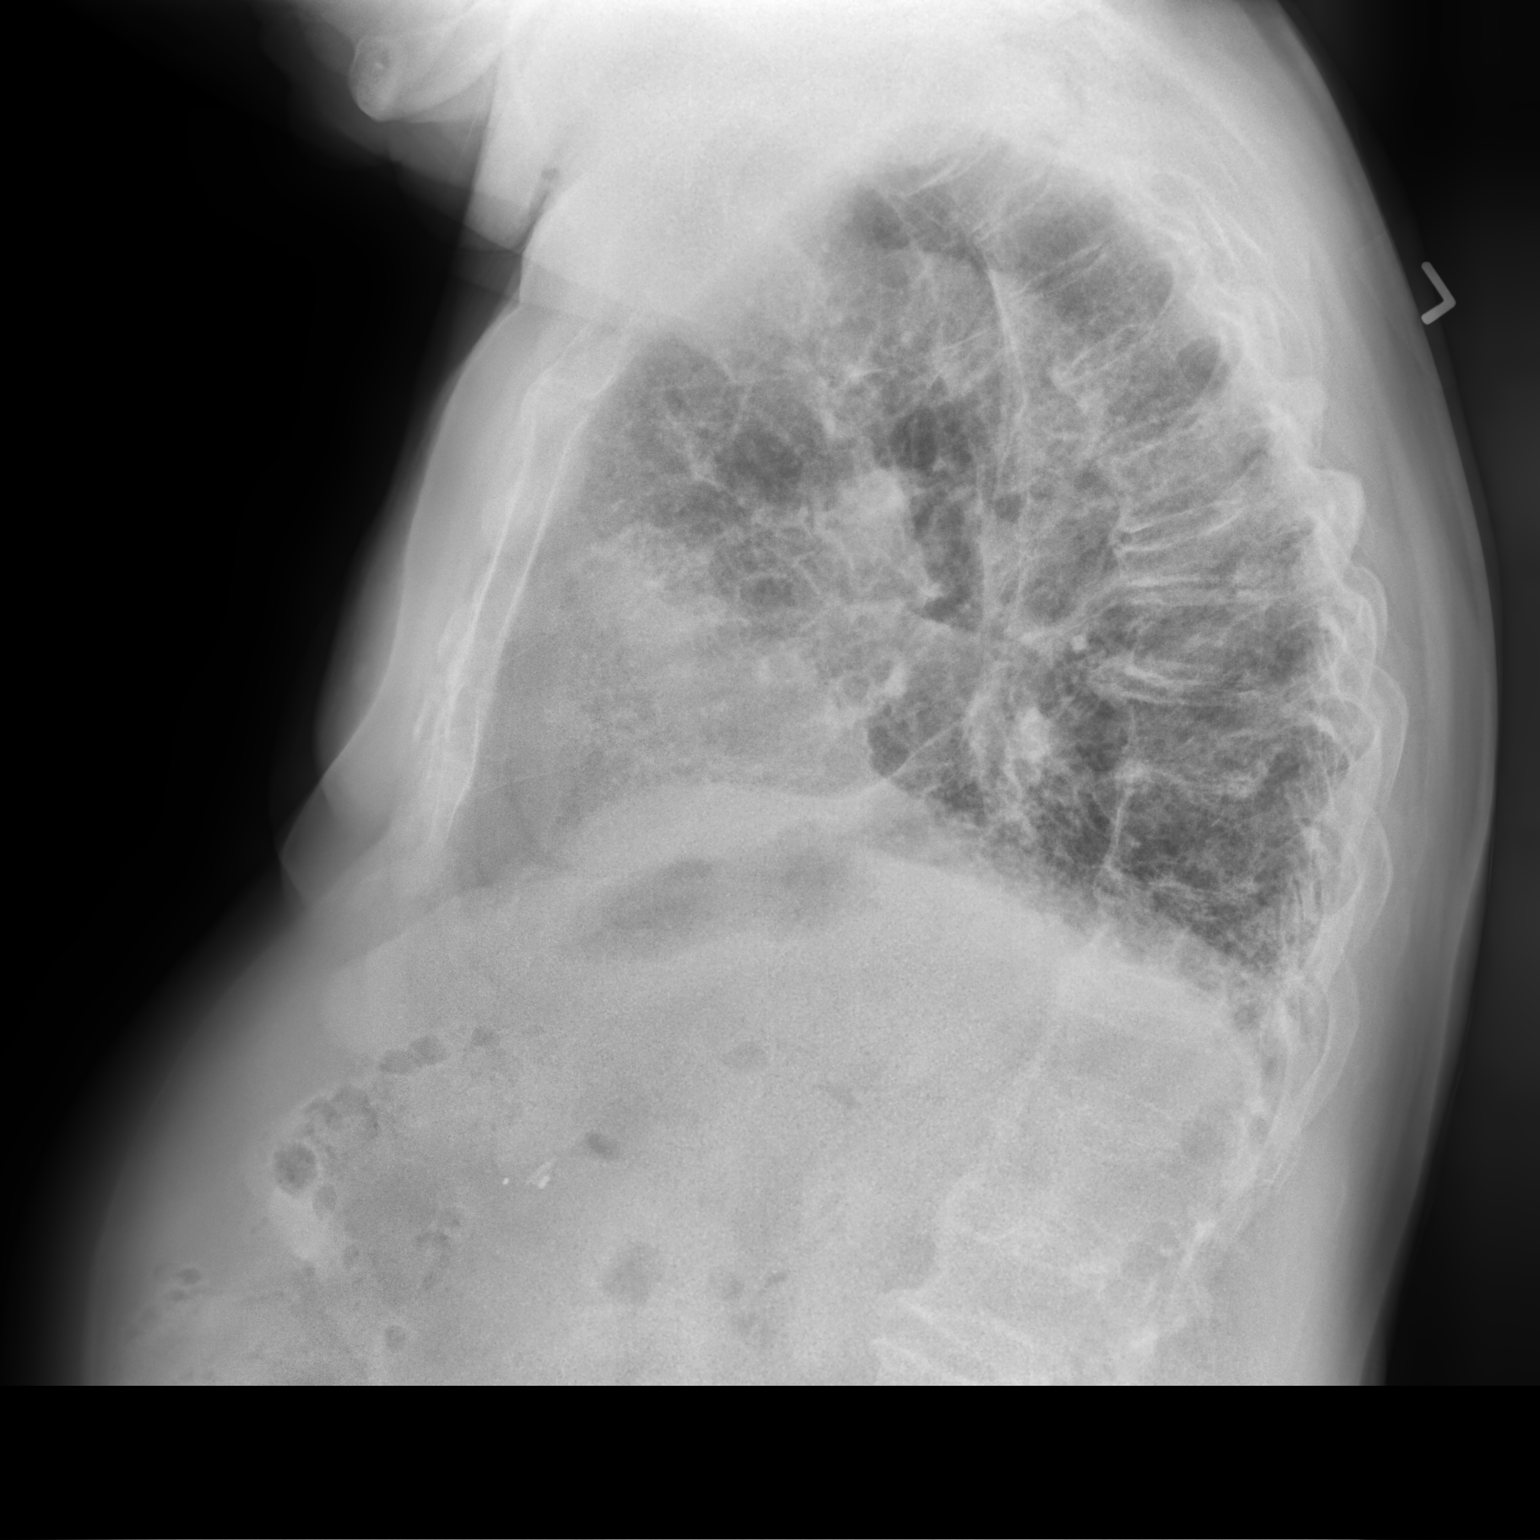

[2 of 2 positions shown; findings below may reference images not displayed]

FINDINGS: Enlargement of cardiac silhouette.

Atherosclerotic calcification aorta.

Mediastinal contours and pulmonary vascularity normal.

Chronic interstitial lung disease changes/pulmonary fibrosis in the
periphery of the lungs greater at bases, unchanged.

Linear scarring at LEFT base.

No definite superimposed acute infiltrate, pleural effusion or
pneumothorax.

Osseous demineralization.
IMPRESSION: Peripheral pulmonary fibrosis bilaterally greater at bases.

Enlargement of cardiac silhouette.

LEFT basilar scarring without acute infiltrate.

Aortic Atherosclerosis ([id number]).

## 2022-09-17 DIAGNOSIS — N390 Urinary tract infection, site not specified: Secondary | ICD-10-CM | POA: Diagnosis not present

## 2022-09-24 DIAGNOSIS — J84112 Idiopathic pulmonary fibrosis: Secondary | ICD-10-CM | POA: Diagnosis not present

## 2022-09-24 DIAGNOSIS — J9611 Chronic respiratory failure with hypoxia: Secondary | ICD-10-CM | POA: Diagnosis not present

## 2022-10-01 ENCOUNTER — Telehealth: Payer: Self-pay

## 2022-10-01 NOTE — Telephone Encounter (Signed)
Received message from patient's daughter regarding patient needing an appointment for abdominal pain.  I called back to schedule and received her voice mail. Message left to return call.

## 2022-10-02 ENCOUNTER — Ambulatory Visit (INDEPENDENT_AMBULATORY_CARE_PROVIDER_SITE_OTHER): Payer: Medicare HMO | Admitting: Legal Medicine

## 2022-10-02 ENCOUNTER — Encounter: Payer: Self-pay | Admitting: Legal Medicine

## 2022-10-02 VITALS — BP 120/60 | HR 73 | Temp 97.6°F | Resp 17 | Ht 65.0 in | Wt 186.0 lb

## 2022-10-02 DIAGNOSIS — H8103 Meniere's disease, bilateral: Secondary | ICD-10-CM | POA: Diagnosis not present

## 2022-10-02 DIAGNOSIS — R1084 Generalized abdominal pain: Secondary | ICD-10-CM | POA: Diagnosis not present

## 2022-10-02 DIAGNOSIS — J439 Emphysema, unspecified: Secondary | ICD-10-CM | POA: Diagnosis not present

## 2022-10-02 MED ORDER — ONDANSETRON HCL 4 MG PO TABS: 4.0000 mg | ORAL_TABLET | Freq: Three times a day (TID) | ORAL | 3 refills | Status: DC | PRN

## 2022-10-02 MED ORDER — DIAZEPAM 2 MG PO TABS: 2.0000 mg | ORAL_TABLET | Freq: Four times a day (QID) | ORAL | 2 refills | Status: DC | PRN

## 2022-10-02 NOTE — Progress Notes (Signed)
Acute Office Visit  Subjective:    Patient ID: Dalton Flores, male    DOB: [birth year], 88 y.o.   MRN: [id number]  Chief Complaint  Patient presents with   Dizziness   Nausea   Diarrhea    HPI: Patient is in today for dizziness and nauseas, but he is not willing to take meclizine. He had diarrhea, however today he is doing better.  He would like something for nauseas when he has flare up of meniere's disease.Has nausea with it and vomits or dry heaves. He fell and went to ER no workup.  He has chronic abdominal pain  Past Medical History:  Diagnosis Date   Emphysema lung ([place])    Hearing loss    Hypothyroid    Macular degeneration    Meniere disease    Prostate cancer ([place]) 2008   Pulmonary fibrosis [facility])     Past Surgical History:  Procedure Laterality Date   APPENDECTOMY  1946   BACK SURGERY  2005   BLADDER SURGERY  12/16/2019    cystoscopy internal urethrotomy with balloon and holmium and bilateral retrograde pyelograms with dilitation of stricture.    CATARACT EXTRACTION, BILATERAL     CHOLECYSTECTOMY  08/2016   HERNIA REPAIR  1978   LAMINECTOMY     PROSTATECTOMY  2005   TONSILLECTOMY      Family History  Problem Relation Age of Onset   Heart disease Mother    Emphysema Father    Coronary artery disease Father    CVA Father    Brain cancer Sister    Cancer Sister    Diabetes Sister    Diabetes Sister    Parkinson's disease Brother    Heart attack Brother    COPD Brother    Heart attack Brother    Parkinson's disease Brother    Diabetes Brother     Social History   Socioeconomic History   Marital status: Married    Spouse name: Not on file   Number of children: Not on file   Years of education: Not on file   Highest education level: Not on file  Occupational History   Occupation: retired  Tobacco Use   Smoking status: Never   Smokeless tobacco: Never  Vaping Use   Vaping Use: Never used  Substance and Sexual Activity   Alcohol  use: No   Drug use: No   Sexual activity: Not on file  Other Topics Concern   Not on file  Social History Narrative   Not on file   Social Determinants of Health   Financial Resource Strain: Not on file  Food Insecurity: Not on file  Transportation Needs: Not on file  Physical Activity: Not on file  Stress: Not on file  Social Connections: Not on file  Intimate Partner Violence: Not on file    Outpatient Medications Prior to Visit  Medication Sig Dispense Refill   Multiple Vitamins-Minerals (PRESERVISION AREDS PO) Take by mouth.     ezetimibe (ZETIA) 10 MG tablet Take 1 tablet (10 mg total) by mouth daily. (Patient not taking: Reported on 10/02/2022) 90 tablet 3   Pirfenidone 267 MG CAPS Take 3 capsules by mouth 3 (three) times daily. (Patient not taking: Reported on 10/02/2022)     tamsulosin (FLOMAX) 0.4 MG CAPS capsule  (Patient not taking: Reported on 10/02/2022)     No facility-administered medications prior to visit.    Allergies  Allergen Reactions   Hydrochlorothiazide Other (See Comments)  Dizziness    Nitrofurantoin Rash   Penicillins Rash    Review of Systems  Constitutional:  Negative for chills, fatigue, fever and unexpected weight change.  HENT:  Negative for congestion, ear pain, sinus pain and sore throat.   Respiratory:  Positive for shortness of breath. Negative for cough.   Cardiovascular:  Negative for chest pain and palpitations.  Gastrointestinal:  Positive for constipation, diarrhea and nausea. Negative for abdominal pain, blood in stool and vomiting.  Endocrine: Negative for polydipsia.  Genitourinary:  Negative for dysuria.  Musculoskeletal:  Negative for back pain.  Skin:  Negative for rash.  Neurological:  Positive for dizziness and light-headedness. Negative for weakness and headaches.       Objective:    Physical Exam Vitals reviewed.  Constitutional:      Appearance: Normal appearance. He is obese.  HENT:     Head:  Normocephalic.     Right Ear: Tympanic membrane normal.     Nose: Nose normal.     Mouth/Throat:     Mouth: Mucous membranes are moist.     Pharynx: Oropharynx is clear.  Eyes:     Extraocular Movements: Extraocular movements intact.     Conjunctiva/sclera: Conjunctivae normal.     Pupils: Pupils are equal, round, and reactive to light.  Cardiovascular:     Rate and Rhythm: Normal rate and regular rhythm.     Heart sounds: No murmur heard.    No gallop.  Pulmonary:     Effort: Pulmonary effort is normal. No respiratory distress.     Breath sounds: Normal breath sounds. No wheezing.  Abdominal:     General: Abdomen is flat. Bowel sounds are normal. There is no distension.     Tenderness: There is no abdominal tenderness.  Musculoskeletal:        General: Normal range of motion.     Cervical back: Normal range of motion.  Skin:    General: Skin is warm.     Capillary Refill: Capillary refill takes less than 2 seconds.  Neurological:     General: No focal deficit present.     Mental Status: He is alert and oriented to person, place, and time.     Gait: Gait abnormal.     Comments: vertigo  Psychiatric:        Mood and Affect: Mood normal.     BP 120/60   Pulse 73   Temp 97.6 F (36.4 C)   Resp 17   Ht _0  (1.651 m)   Wt 186 lb (84.4 kg)   SpO2 94%   BMI 30.95 kg/m  Wt Readings from Last 3 Encounters:  10/02/22 186 lb (84.4 kg)  05/09/22 186 lb (84.4 kg)  11/07/21 192 lb 6.4 oz (87.3 kg)    Health Maintenance Due  Topic Date Due   TETANUS/TDAP  Never done   Zoster Vaccines- Shingrix (1 of 2) Never done   COVID-19 Vaccine (4 - Moderna series) 09/25/2021   INFLUENZA VACCINE  07/03/2022   Medicare Annual Wellness (AWV)  10/23/2022    There are no preventive care reminders to display for this patient.   Lab Results  Component Value Date   TSH 4.730 (H) 05/09/2022   Lab Results  Component Value Date   WBC 7.4 05/09/2022   HGB 15.1 05/09/2022   HCT 43.9  05/09/2022   MCV 89 05/09/2022   PLT 250 05/09/2022   Lab Results  Component Value Date   NA 142 05/09/2022  K 4.6 05/09/2022   CO2 26 05/09/2022   GLUCOSE 104 (H) 05/09/2022   BUN 15 05/09/2022   CREATININE 1.10 05/09/2022   BILITOT 0.3 05/09/2022   ALKPHOS 129 (H) 05/09/2022   AST 14 05/09/2022   ALT 12 05/09/2022   PROT 6.6 05/09/2022   ALBUMIN 4.1 05/09/2022   CALCIUM 9.1 05/09/2022   EGFR 64 05/09/2022   GFR 63.42 06/20/2021   Lab Results  Component Value Date   CHOL 203 (H) 05/09/2022   Lab Results  Component Value Date   HDL 35 (L) 05/09/2022   Lab Results  Component Value Date   LDLCALC 137 (H) 05/09/2022   Lab Results  Component Value Date   TRIG 174 (H) 05/09/2022   Lab Results  Component Value Date   CHOLHDL 5.8 (H) 05/09/2022   Lab Results  Component Value Date   HGBA1C 5.3 05/09/2022       Assessment & Plan:   Problem List Items Addressed This Visit       Nervous and Auditory   Meniere's disease - Primary   Relevant Medications   ondansetron (ZOFRAN) 4 MG tablet Patient has chronic recurrent meniere disease, he has vertigo now for weeks. Treat with zofran for nausea and valium 24m PRN dizziness   Other Visit Diagnoses     Generalized abdominal pain       Relevant Orders   Ambulatory referral to Gastroenterology Recurrent abdominal pain with eating, needs GI consult and at least colonoscopy           Follow-up: Return if symptoms worsen or fail to improve.  An After Visit Summary was printed and given to the patient.  LReinaldo Meeker MD [facility] ([phone]

## 2022-10-22 DIAGNOSIS — J9611 Chronic respiratory failure with hypoxia: Secondary | ICD-10-CM | POA: Diagnosis not present

## 2022-10-22 DIAGNOSIS — J84112 Idiopathic pulmonary fibrosis: Secondary | ICD-10-CM | POA: Diagnosis not present

## 2022-11-08 DIAGNOSIS — R946 Abnormal results of thyroid function studies: Secondary | ICD-10-CM | POA: Insufficient documentation

## 2022-11-08 NOTE — Assessment & Plan Note (Addendum)
No medications. Management per specialist.  Dr. Alcide Clever.

## 2022-11-08 NOTE — Assessment & Plan Note (Signed)
Comorbidities: hyperlipidemia.  Recommend continue to work on eating healthy diet.

## 2022-11-08 NOTE — Progress Notes (Signed)
Subjective:  Patient ID: Dalton Flores, male    DOB: Jan 23, 1929  Age: 88 y.o. MRN: [id number]  Chief Complaint  Patient presents with   Hyperlipidemia    HPI: Meniere's disease: ENT, Dr Idelle Crouch, ENT. No other treatments to offer. Dizzy. Not driving.   Pulmonary fibrosis: He sees Dr. Alcide Clever. Intolerant to ofev (diarrhea).  Patient currently uses 2 L of oxygen at night and with exertion. . Tolerating pretty well.  Uses oxygen with exertion and at night. Took esprit - caused diarrhea also.   Hyperlipidemia:  Patient has stopped the fish oil   Diet: appetite not on diet.   Exercise: Unable to do much exertional. Can still use his riding mower.     Current Outpatient Medications on File Prior to Visit  Medication Sig Dispense Refill   diazepam (VALIUM) 2 MG tablet Take 1 tablet (2 mg total) by mouth every 6 (six) hours as needed for anxiety. 30 tablet 2   Multiple Vitamins-Minerals (PRESERVISION AREDS PO) Take by mouth.     ondansetron (ZOFRAN) 4 MG tablet Take 1 tablet (4 mg total) by mouth every 8 (eight) hours as needed for nausea or vomiting. 20 tablet 3   No current facility-administered medications on file prior to visit.   Past Medical History:  Diagnosis Date   Emphysema lung ([place])    Hearing loss    Hypothyroid    Macular degeneration    Meniere disease    Prostate cancer ([place]) 2008   Pulmonary fibrosis [facility])    Past Surgical History:  Procedure Laterality Date   APPENDECTOMY  1946   BACK SURGERY  2005   BLADDER SURGERY  12/16/2019    cystoscopy internal urethrotomy with balloon and holmium and bilateral retrograde pyelograms with dilitation of stricture.    CATARACT EXTRACTION, BILATERAL     CHOLECYSTECTOMY  08/2016   HERNIA REPAIR  1978   LAMINECTOMY     PROSTATECTOMY  2005   TONSILLECTOMY      Family History  Problem Relation Age of Onset   Heart disease Mother    Emphysema Father    Coronary artery disease Father    CVA Father    Brain cancer  Sister    Cancer Sister    Diabetes Sister    Diabetes Sister    Parkinson's disease Brother    Heart attack Brother    COPD Brother    Heart attack Brother    Parkinson's disease Brother    Diabetes Brother    Social History   Socioeconomic History   Marital status: Married    Spouse name: Not on file   Number of children: Not on file   Years of education: Not on file   Highest education level: Not on file  Occupational History   Occupation: retired  Tobacco Use   Smoking status: Never   Smokeless tobacco: Never  Vaping Use   Vaping Use: Never used  Substance and Sexual Activity   Alcohol use: No   Drug use: No   Sexual activity: Not on file  Other Topics Concern   Not on file  Social History Narrative   Not on file   Social Determinants of Health   Financial Resource Strain: Not on file  Food Insecurity: Not on file  Transportation Needs: Not on file  Physical Activity: Not on file  Stress: Not on file  Social Connections: Not on file    Review of Systems  Constitutional:  Positive for appetite change (drinks  ensure 2-3 days. two meals per day. Egg/sausage/gravy. supper: sandwiches. family brings meals.) and fatigue. Negative for fever.  HENT:  Negative for congestion, ear pain, sinus pressure and sore throat.   Respiratory:  Positive for cough and shortness of breath. Negative for chest tightness and wheezing.   Cardiovascular:  Negative for chest pain and palpitations.  Gastrointestinal:  Positive for abdominal pain (Appt. w/GI at the end of Jan.lower abdomen.) and constipation (Epsom salts works within an hour.). Negative for diarrhea, nausea and vomiting.  Genitourinary:  Negative for dysuria and hematuria.  Musculoskeletal:  Positive for arthralgias (left hand pain - started at 3 am. better now. not cramping.). Negative for back pain, joint swelling and myalgias.  Skin:  Negative for rash.  Neurological:  Positive for dizziness. Negative for weakness and  headaches.       Balance issues  Psychiatric/Behavioral:  Negative for dysphoric mood. The patient is not nervous/anxious.      Objective:  There were no vitals taken for this visit.     10/02/2022   10:49 AM 05/09/2022    8:08 AM 11/07/2021    8:57 AM  BP/Weight  Systolic BP 850 277 412  Diastolic BP 60 60 74  Wt. (Lbs) 186 186 192.4  BMI 30.95 kg/m2 30.95 kg/m2 32.02 kg/m2    Physical Exam Vitals reviewed.  Constitutional:      Appearance: Normal appearance. He is normal weight.  Cardiovascular:     Rate and Rhythm: Normal rate and regular rhythm.     Heart sounds: Normal heart sounds.  Pulmonary:     Effort: Pulmonary effort is normal.     Breath sounds: Normal breath sounds.  Abdominal:     General: Abdomen is flat. Bowel sounds are normal.     Palpations: Abdomen is soft.  Neurological:     Mental Status: He is alert and oriented to person, place, and time.  Psychiatric:        Mood and Affect: Mood normal.        Behavior: Behavior normal.     Diabetic Foot Exam - Simple   No data filed      Lab Results  Component Value Date   WBC 7.4 05/09/2022   HGB 15.1 05/09/2022   HCT 43.9 05/09/2022   PLT 250 05/09/2022   GLUCOSE 104 (H) 05/09/2022   CHOL 203 (H) 05/09/2022   TRIG 174 (H) 05/09/2022   HDL 35 (L) 05/09/2022   LDLCALC 137 (H) 05/09/2022   ALT 12 05/09/2022   AST 14 05/09/2022   NA 142 05/09/2022   K 4.6 05/09/2022   CL 102 05/09/2022   CREATININE 1.10 05/09/2022   BUN 15 05/09/2022   CO2 26 05/09/2022   TSH 4.730 (H) 05/09/2022   HGBA1C 5.3 05/09/2022      Assessment & Plan:   Problem List Items Addressed This Visit       Respiratory   IPF (idiopathic pulmonary fibrosis) (HCC)    Stable. The current medical regimen is effective; continue present plan and medications.         Nervous and Auditory   Meniere's disease    No other treatments.        Other   Mixed hyperlipidemia - Primary    Control: Refuses statins.  Continue Zetia 10 mg daily, and OTC Fish Oil.  Continue to work on eating a healthy diet. Labs drawn today.       Class 1 obesity due to excess calories with body  mass index (BMI) of 30.0 to 30.9 in adult    Comorbidities: hyperlipidemia.  Recommend continue to work on eating healthy diet.      Abnormal thyroid function test    Recheck TSH.     Marland Kitchen  No orders of the defined types were placed in this encounter.   No orders of the defined types were placed in this encounter.    Follow-up: No follow-ups on file.  An After Visit Summary was printed and given to the patient.  Rochel Brome, MD [facility] [phone]

## 2022-11-08 NOTE — Assessment & Plan Note (Signed)
Recheck TSH 

## 2022-11-08 NOTE — Assessment & Plan Note (Addendum)
No other treatments recommended.

## 2022-11-08 NOTE — Assessment & Plan Note (Addendum)
Control: poor Refuses statins. Continue Zetia 10 mg daily, and OTC Fish Oil.  Continue to work on eating a healthy diet. Labs drawn today.

## 2022-11-09 ENCOUNTER — Ambulatory Visit (INDEPENDENT_AMBULATORY_CARE_PROVIDER_SITE_OTHER): Payer: Medicare HMO | Admitting: Family Medicine

## 2022-11-09 VITALS — BP 100/62 | HR 72 | Temp 96.3°F | Resp 16 | Ht 65.0 in | Wt 176.0 lb

## 2022-11-09 DIAGNOSIS — Z6830 Body mass index (BMI) 30.0-30.9, adult: Secondary | ICD-10-CM | POA: Diagnosis not present

## 2022-11-09 DIAGNOSIS — N3000 Acute cystitis without hematuria: Secondary | ICD-10-CM | POA: Diagnosis not present

## 2022-11-09 DIAGNOSIS — E782 Mixed hyperlipidemia: Secondary | ICD-10-CM | POA: Diagnosis not present

## 2022-11-09 DIAGNOSIS — H8103 Meniere's disease, bilateral: Secondary | ICD-10-CM | POA: Diagnosis not present

## 2022-11-09 DIAGNOSIS — Z0001 Encounter for general adult medical examination with abnormal findings: Secondary | ICD-10-CM

## 2022-11-09 DIAGNOSIS — R1030 Lower abdominal pain, unspecified: Secondary | ICD-10-CM | POA: Diagnosis not present

## 2022-11-09 DIAGNOSIS — Z23 Encounter for immunization: Secondary | ICD-10-CM | POA: Diagnosis not present

## 2022-11-09 DIAGNOSIS — R946 Abnormal results of thyroid function studies: Secondary | ICD-10-CM | POA: Diagnosis not present

## 2022-11-09 DIAGNOSIS — E6609 Other obesity due to excess calories: Secondary | ICD-10-CM | POA: Diagnosis not present

## 2022-11-09 DIAGNOSIS — H906 Mixed conductive and sensorineural hearing loss, bilateral: Secondary | ICD-10-CM

## 2022-11-09 DIAGNOSIS — J84112 Idiopathic pulmonary fibrosis: Secondary | ICD-10-CM

## 2022-11-09 DIAGNOSIS — G4734 Idiopathic sleep related nonobstructive alveolar hypoventilation: Secondary | ICD-10-CM

## 2022-11-09 LAB — POCT URINALYSIS DIP (CLINITEK)
Bilirubin, UA: NEGATIVE
Blood, UA: NEGATIVE
Glucose, UA: NEGATIVE mg/dL
Ketones, POC UA: NEGATIVE mg/dL
Nitrite, UA: POSITIVE — AB
Spec Grav, UA: 1.015 (ref 1.010–1.025)
Urobilinogen, UA: 0.2 E.U./dL
pH, UA: 6 (ref 5.0–8.0)

## 2022-11-09 MED ORDER — CIPROFLOXACIN HCL 250 MG PO TABS: 250.0000 mg | ORAL_TABLET | Freq: Two times a day (BID) | ORAL | 0 refills | Status: AC

## 2022-11-09 NOTE — Patient Instructions (Addendum)
Recommend miralax one dose daily.   Recommend tetanus (tdap) and shingrix (2 series) in the next weeks.

## 2022-11-10 LAB — CBC WITH DIFF/PLATELET
Basophils Absolute: 0.1 10*3/uL (ref 0.0–0.2)
Basos: 1 %
EOS (ABSOLUTE): 0.2 10*3/uL (ref 0.0–0.4)
Eos: 3 %
Hematocrit: 47.8 % (ref 37.5–51.0)
Hemoglobin: 16.1 g/dL (ref 13.0–17.7)
Immature Grans (Abs): 0 10*3/uL (ref 0.0–0.1)
Immature Granulocytes: 0 %
Lymphocytes Absolute: 1.7 10*3/uL (ref 0.7–3.1)
Lymphs: 23 %
MCH: 30.7 pg (ref 26.6–33.0)
MCHC: 33.7 g/dL (ref 31.5–35.7)
MCV: 91 fL (ref 79–97)
Monocytes Absolute: 0.7 10*3/uL (ref 0.1–0.9)
Monocytes: 10 %
Neutrophils Absolute: 4.6 10*3/uL (ref 1.4–7.0)
Neutrophils: 63 %
Platelets: 256 10*3/uL (ref 150–450)
RBC: 5.25 x10E6/uL (ref 4.14–5.80)
RDW: 13.4 % (ref 11.6–15.4)
WBC: 7.3 10*3/uL (ref 3.4–10.8)

## 2022-11-10 LAB — COMPREHENSIVE METABOLIC PANEL
ALT: 11 IU/L (ref 0–44)
AST: 21 IU/L (ref 0–40)
Albumin/Globulin Ratio: 1.6 (ref 1.2–2.2)
Albumin: 4.1 g/dL (ref 3.6–4.6)
Alkaline Phosphatase: 108 IU/L (ref 44–121)
BUN/Creatinine Ratio: 20 (ref 10–24)
BUN: 20 mg/dL (ref 10–36)
Bilirubin Total: 0.2 mg/dL (ref 0.0–1.2)
CO2: 23 mmol/L (ref 20–29)
Calcium: 9.4 mg/dL (ref 8.6–10.2)
Chloride: 101 mmol/L (ref 96–106)
Creatinine, Ser: 1.02 mg/dL (ref 0.76–1.27)
Globulin, Total: 2.6 g/dL (ref 1.5–4.5)
Glucose: 102 mg/dL — ABNORMAL HIGH (ref 70–99)
Potassium: 5.3 mmol/L — ABNORMAL HIGH (ref 3.5–5.2)
Sodium: 140 mmol/L (ref 134–144)
Total Protein: 6.7 g/dL (ref 6.0–8.5)
eGFR: 69 mL/min/{1.73_m2} (ref >59)

## 2022-11-10 LAB — LIPID PANEL
Chol/HDL Ratio: 4.7 ratio (ref 0.0–5.0)
Cholesterol, Total: 193 mg/dL (ref 100–199)
HDL: 41 mg/dL (ref >39)
LDL Chol Calc (NIH): 123 mg/dL — ABNORMAL HIGH (ref 0–99)
Triglycerides: 161 mg/dL — ABNORMAL HIGH (ref 0–149)
VLDL Cholesterol Cal: 29 mg/dL (ref 5–40)

## 2022-11-10 LAB — T4, FREE: Free T4: 1.3 ng/dL (ref 0.82–1.77)

## 2022-11-10 LAB — TSH: TSH: 4.01 u[IU]/mL (ref 0.450–4.500)

## 2022-11-10 LAB — CARDIOVASCULAR RISK ASSESSMENT

## 2022-11-11 ENCOUNTER — Encounter: Payer: Self-pay | Admitting: Family Medicine

## 2022-11-11 DIAGNOSIS — N3000 Acute cystitis without hematuria: Secondary | ICD-10-CM | POA: Insufficient documentation

## 2022-11-11 DIAGNOSIS — R1030 Lower abdominal pain, unspecified: Secondary | ICD-10-CM | POA: Insufficient documentation

## 2022-11-11 DIAGNOSIS — Z0001 Encounter for general adult medical examination with abnormal findings: Secondary | ICD-10-CM | POA: Insufficient documentation

## 2022-11-11 DIAGNOSIS — Z23 Encounter for immunization: Secondary | ICD-10-CM | POA: Insufficient documentation

## 2022-11-11 NOTE — Assessment & Plan Note (Signed)
Wears hearing aids 

## 2022-11-11 NOTE — Assessment & Plan Note (Signed)
Continue to wear oxygen 2 L at night and as needed for exertion.

## 2022-11-11 NOTE — Assessment & Plan Note (Signed)
Prescription: cipro.

## 2022-11-11 NOTE — Assessment & Plan Note (Signed)
Education given. Recommend tetanus (tdap) and shingrix (2 series) in the next weeks.

## 2022-11-11 NOTE — Assessment & Plan Note (Signed)
UTI vs constipation. Recommend miralax one dose daily.

## 2022-11-13 LAB — URINE CULTURE

## 2022-11-14 DIAGNOSIS — H353123 Nonexudative age-related macular degeneration, left eye, advanced atrophic without subfoveal involvement: Secondary | ICD-10-CM | POA: Diagnosis not present

## 2022-11-21 DIAGNOSIS — N451 Epididymitis: Secondary | ICD-10-CM | POA: Diagnosis not present

## 2022-11-21 DIAGNOSIS — C61 Malignant neoplasm of prostate: Secondary | ICD-10-CM | POA: Diagnosis not present

## 2022-11-21 DIAGNOSIS — N3289 Other specified disorders of bladder: Secondary | ICD-10-CM | POA: Diagnosis not present

## 2022-12-20 DIAGNOSIS — H353213 Exudative age-related macular degeneration, right eye, with inactive scar: Secondary | ICD-10-CM | POA: Diagnosis not present

## 2022-12-20 DIAGNOSIS — H353222 Exudative age-related macular degeneration, left eye, with inactive choroidal neovascularization: Secondary | ICD-10-CM | POA: Diagnosis not present

## 2022-12-24 ENCOUNTER — Telehealth: Payer: Self-pay

## 2022-12-24 NOTE — Telephone Encounter (Signed)
Patient daughter called and stated that Friday he start having congestion, and yesterday he started running a fever and complaining of headache and sore throat, denies any difference in breathing and stated the fever broke after giving him tylenol. She tested him for covid and it was positive, He has a video visit with Marge Duncans tomorrow at 9:40 AM.

## 2022-12-25 ENCOUNTER — Telehealth (INDEPENDENT_AMBULATORY_CARE_PROVIDER_SITE_OTHER): Payer: Medicare HMO | Admitting: Physician Assistant

## 2022-12-25 ENCOUNTER — Encounter: Payer: Self-pay | Admitting: Physician Assistant

## 2022-12-25 VITALS — Ht 65.0 in | Wt 176.0 lb

## 2022-12-25 DIAGNOSIS — U071 COVID-19: Secondary | ICD-10-CM

## 2022-12-25 MED ORDER — NIRMATRELVIR/RITONAVIR (PAXLOVID)TABLET: 3.0000 | ORAL_TABLET | Freq: Two times a day (BID) | ORAL | 0 refills | Status: AC

## 2022-12-25 NOTE — Progress Notes (Signed)
Virtual Visit via Telephone Note   This visit type was conducted due to national recommendations for restrictions regarding the COVID-19 Pandemic (e.g. social distancing) in an effort to limit this patient's exposure and mitigate transmission in our community.  Due to his co-morbid illnesses, this patient is at least at moderate risk for complications without adequate follow up.  This format is felt to be most appropriate for this patient at this time.  The patient did not have access to video technology/had technical difficulties with video requiring transitioning to audio format only (telephone).  All issues noted in this document were discussed and addressed.  No physical exam could be performed with this format.  Patient verbally consented to a telehealth visit.   Date:  12/25/2022   ID:  Dalton Flores, DOB November 07, 1929, MRN [id number]  Patient Location: Home Provider Location: Office  PCP:  Rochel Brome, MD    Chief Complaint:  COVID  History of Present Illness:    Dalton Flores is a 89 y.o. male with complaints of cough, congestion, sore throat and malaise that started a few days ago.  He did have fever yesterday.  Denies chest pain or dyspnea.  Did test positive for COVID yesterday  The patient does have symptoms concerning for COVID-19 infection (fever, chills, cough, or new shortness of breath).    Past Medical History:  Diagnosis Date   Emphysema lung ([place])    Hearing loss    Hypothyroid    Macular degeneration    Meniere disease    Prostate cancer ([place]) 2008   Pulmonary fibrosis [facility])    Past Surgical History:  Procedure Laterality Date   APPENDECTOMY  1946   BACK SURGERY  2005   BLADDER SURGERY  12/16/2019    cystoscopy internal urethrotomy with balloon and holmium and bilateral retrograde pyelograms with dilitation of stricture.    CATARACT EXTRACTION, BILATERAL     CHOLECYSTECTOMY  08/2016   HERNIA REPAIR  1978   LAMINECTOMY     PROSTATECTOMY  2005    TONSILLECTOMY       Current Meds  Medication Sig   Multiple Vitamins-Minerals (PRESERVISION AREDS PO) Take by mouth.   nirmatrelvir/ritonavir (PAXLOVID) 20 x 150 MG & 10 x '100MG'$  TABS Take 3 tablets by mouth 2 (two) times daily for 5 days. Patient GFR is 69   tamsulosin (FLOMAX) 0.4 MG CAPS capsule Take 0.4 mg by mouth daily.     Allergies:   Hydrochlorothiazide, Nitrofurantoin, and Penicillins   Social History   Tobacco Use   Smoking status: Never   Smokeless tobacco: Never  Vaping Use   Vaping Use: Never used  Substance Use Topics   Alcohol use: No   Drug use: No     Family Hx: The patient's family history includes Brain cancer in his sister; COPD in his brother; CVA in his father; Cancer in his sister; Coronary artery disease in his father; Diabetes in his brother, sister, and sister; Emphysema in his father; Heart attack in his brother and brother; Heart disease in his mother; Parkinson's disease in his brother and brother.  ROS:   Please see the history of present illness.    All other systems reviewed and are negative.  Labs/Other Tests and Data Reviewed:    Recent Labs: 11/09/2022: ALT 11; BUN 20; Creatinine, Ser 1.02; Hemoglobin 16.1; Platelets 256; Potassium 5.3; Sodium 140; TSH 4.010   Recent Lipid Panel Lab Results  Component Value Date/Time   CHOL 193 11/09/2022 09:37 AM  TRIG 161 (H) 11/09/2022 09:37 AM   HDL 41 11/09/2022 09:37 AM   CHOLHDL 4.7 11/09/2022 09:37 AM   LDLCALC 123 (H) 11/09/2022 09:37 AM    Wt Readings from Last 3 Encounters:  12/25/22 176 lb (79.8 kg)  11/09/22 176 lb (79.8 kg)  10/02/22 186 lb (84.4 kg)     Objective:    Vital Signs:  Ht '5\' 5"'$  (1.651 m)   Wt 176 lb (79.8 kg)   BMI 29.29 kg/m    VITAL SIGNS:  reviewed GEN:  no acute distress  ASSESSMENT & PLAN:    COVID 19 - rx for paxlovid and recommend to take otc decongestants, rest, fluids and quarantine according to [facility] guidelines Follow up if any symptoms change or  worsen  COVID-19 Education: The signs and symptoms of COVID-19 were discussed with the patient and how to seek care for testing (follow up with PCP or arrange E-visit). The importance of social distancing was discussed today.  Time:   Today, I have spent 10 minutes with the patient with telehealth technology discussing the above problems.     Medication Adjustments/Labs and Tests Ordered: Current medicines are reviewed at length with the patient today.  Concerns regarding medicines are outlined above.   Tests Ordered: No orders of the defined types were placed in this encounter.   Medication Changes: Meds ordered this encounter  Medications   nirmatrelvir/ritonavir (PAXLOVID) 20 x 150 MG & 10 x '100MG'$  TABS    Sig: Take 3 tablets by mouth 2 (two) times daily for 5 days. Patient GFR is 69    Dispense:  30 tablet    Refill:  0    Order Specific Question:   Supervising Provider    AnswerShelton Silvas    Follow Up:  In Person prn  Signed, Yetta Flock  12/25/2022 9:45 AM    [place]

## 2022-12-28 DIAGNOSIS — I447 Left bundle-branch block, unspecified: Secondary | ICD-10-CM | POA: Diagnosis not present

## 2022-12-28 DIAGNOSIS — J984 Other disorders of lung: Secondary | ICD-10-CM | POA: Diagnosis not present

## 2022-12-28 DIAGNOSIS — U071 COVID-19: Secondary | ICD-10-CM | POA: Diagnosis not present

## 2022-12-28 DIAGNOSIS — I4510 Unspecified right bundle-branch block: Secondary | ICD-10-CM | POA: Diagnosis not present

## 2022-12-28 DIAGNOSIS — J8410 Pulmonary fibrosis, unspecified: Secondary | ICD-10-CM | POA: Diagnosis not present

## 2022-12-28 DIAGNOSIS — J84112 Idiopathic pulmonary fibrosis: Secondary | ICD-10-CM | POA: Diagnosis not present

## 2022-12-28 DIAGNOSIS — R0602 Shortness of breath: Secondary | ICD-10-CM | POA: Diagnosis not present

## 2022-12-28 DIAGNOSIS — Z79899 Other long term (current) drug therapy: Secondary | ICD-10-CM | POA: Diagnosis not present

## 2022-12-28 DIAGNOSIS — R9431 Abnormal electrocardiogram [ECG] [EKG]: Secondary | ICD-10-CM | POA: Diagnosis not present

## 2022-12-28 DIAGNOSIS — R0902 Hypoxemia: Secondary | ICD-10-CM | POA: Diagnosis not present

## 2022-12-28 DIAGNOSIS — I444 Left anterior fascicular block: Secondary | ICD-10-CM | POA: Diagnosis not present

## 2022-12-28 DIAGNOSIS — I2510 Atherosclerotic heart disease of native coronary artery without angina pectoris: Secondary | ICD-10-CM | POA: Diagnosis not present

## 2022-12-28 DIAGNOSIS — S22060A Wedge compression fracture of T7-T8 vertebra, initial encounter for closed fracture: Secondary | ICD-10-CM | POA: Diagnosis not present

## 2022-12-28 DIAGNOSIS — R069 Unspecified abnormalities of breathing: Secondary | ICD-10-CM | POA: Diagnosis not present

## 2023-01-23 DIAGNOSIS — J9611 Chronic respiratory failure with hypoxia: Secondary | ICD-10-CM | POA: Diagnosis not present

## 2023-01-23 DIAGNOSIS — J84112 Idiopathic pulmonary fibrosis: Secondary | ICD-10-CM | POA: Diagnosis not present

## 2023-01-28 DIAGNOSIS — R0600 Dyspnea, unspecified: Secondary | ICD-10-CM | POA: Diagnosis not present

## 2023-02-08 DIAGNOSIS — J84112 Idiopathic pulmonary fibrosis: Secondary | ICD-10-CM | POA: Diagnosis not present

## 2023-02-08 DIAGNOSIS — I2721 Secondary pulmonary arterial hypertension: Secondary | ICD-10-CM | POA: Diagnosis not present

## 2023-02-08 DIAGNOSIS — J9611 Chronic respiratory failure with hypoxia: Secondary | ICD-10-CM | POA: Diagnosis not present

## 2023-02-11 DIAGNOSIS — K649 Unspecified hemorrhoids: Secondary | ICD-10-CM | POA: Diagnosis not present

## 2023-02-11 DIAGNOSIS — K219 Gastro-esophageal reflux disease without esophagitis: Secondary | ICD-10-CM | POA: Diagnosis not present

## 2023-02-11 DIAGNOSIS — R194 Change in bowel habit: Secondary | ICD-10-CM | POA: Diagnosis not present

## 2023-03-26 DIAGNOSIS — R198 Other specified symptoms and signs involving the digestive system and abdomen: Secondary | ICD-10-CM | POA: Diagnosis not present

## 2023-03-26 DIAGNOSIS — K219 Gastro-esophageal reflux disease without esophagitis: Secondary | ICD-10-CM | POA: Diagnosis not present

## 2023-04-25 DIAGNOSIS — K219 Gastro-esophageal reflux disease without esophagitis: Secondary | ICD-10-CM | POA: Diagnosis not present

## 2023-04-25 DIAGNOSIS — R198 Other specified symptoms and signs involving the digestive system and abdomen: Secondary | ICD-10-CM | POA: Diagnosis not present

## 2023-05-09 NOTE — Progress Notes (Unsigned)
Subjective:  Patient ID: Dalton Flores, male    DOB: November 04, 1929  Age: 89 y.o. MRN: [id number]  Chief Complaint  Patient presents with  . Medical Management of Chronic Issues    HPI   Meniere's disease:  Dr Lenoria Farrier, ENT. No other treatments to offer.  Pulmonary fibrosis: Has changed to Dr. Blenda Nicely. Intolerant to ofev (diarrhea).  Patient currently uses 2 L of oxygen at night and with exertion. Not on Esbriet since March. Tolerating pretty well   Hyperlipidemia: Patient is not taking any medication  Diet: not on diet.   Exercise: Unable to do much exertional. Can still use his riding mower.   Macular Degeneration: No longer getting injections     11/09/2022    8:15 AM 11/16/2021    7:36 PM 11/07/2021    8:34 AM 07/31/2021    8:53 AM 01/30/2021    8:51 AM  Depression screen PHQ 2/9  Decreased Interest 0 0 0 0 0  Down, Depressed, Hopeless 0 0 0 0 0  PHQ - 2 Score 0 0 0 0 0        11/09/2022    8:14 AM  Fall Risk   Falls in the past year? 0  Number falls in past yr: 0  Injury with Fall? 0  Risk for fall due to : Impaired balance/gait;History of fall(s)  Follow up Falls evaluation completed    Patient Care Team: Blane Ohara, MD as PCP - General (Family Medicine) Debroah Baller, MD as Consulting Physician (Urology) Lenoria Farrier Lanny Cramp, MD as Referring Physician (Otolaryngology) Marcellus Scott, MD as Referring Physician (Specialist)   Review of Systems  Constitutional:  Negative for chills, diaphoresis, fatigue and fever.  HENT:  Negative for congestion, ear pain and sore throat.   Respiratory:  Positive for shortness of breath. Negative for cough.   Cardiovascular:  Negative for chest pain and leg swelling.  Gastrointestinal:  Positive for constipation, diarrhea and nausea. Negative for abdominal pain.  Genitourinary:  Negative for dysuria and urgency.  Musculoskeletal:  Negative for arthralgias and myalgias.  Neurological:  Negative for dizziness and  headaches.  Psychiatric/Behavioral:  Negative for dysphoric mood.     Current Outpatient Medications on File Prior to Visit  Medication Sig Dispense Refill  . Multiple Vitamins-Minerals (PRESERVISION AREDS PO) Take by mouth.    . ondansetron (ZOFRAN) 4 MG tablet Take 1 tablet (4 mg total) by mouth every 8 (eight) hours as needed for nausea or vomiting. (Patient not taking: Reported on 11/09/2022) 20 tablet 3  . tamsulosin (FLOMAX) 0.4 MG CAPS capsule Take 0.4 mg by mouth daily.     No current facility-administered medications on file prior to visit.   Past Medical History:  Diagnosis Date  . Emphysema lung (HCC)   . Hearing loss   . Hypothyroid   . Macular degeneration   . Meniere disease   . Prostate cancer (HCC) 2008  . Pulmonary fibrosis (HCC)    Past Surgical History:  Procedure Laterality Date  . APPENDECTOMY  1946  . BACK SURGERY  2005  . BLADDER SURGERY  12/16/2019    cystoscopy internal urethrotomy with balloon and holmium and bilateral retrograde pyelograms with dilitation of stricture.   Marland Kitchen CATARACT EXTRACTION, BILATERAL    . CHOLECYSTECTOMY  08/2016  . HERNIA REPAIR  1978  . LAMINECTOMY    . PROSTATECTOMY  2005  . TONSILLECTOMY      Family History  Problem Relation Age of Onset  . Heart disease Mother   .  Emphysema Father   . Coronary artery disease Father   . CVA Father   . Brain cancer Sister   . Cancer Sister   . Diabetes Sister   . Diabetes Sister   . Parkinson's disease Brother   . Heart attack Brother   . COPD Brother   . Heart attack Brother   . Parkinson's disease Brother   . Diabetes Brother    Social History   Socioeconomic History  . Marital status: Married    Spouse name: Not on file  . Number of children: Not on file  . Years of education: Not on file  . Highest education level: Not on file  Occupational History  . Occupation: retired  Tobacco Use  . Smoking status: Never  . Smokeless tobacco: Never  Vaping Use  . Vaping Use:  Never used  Substance and Sexual Activity  . Alcohol use: No  . Drug use: No  . Sexual activity: Not on file  Other Topics Concern  . Not on file  Social History Narrative  . Not on file   Social Determinants of Health   Financial Resource Strain: Not on file  Food Insecurity: No Food Insecurity (11/09/2022)   Hunger Vital Sign   . Worried About Programme researcher, broadcasting/film/video in the Last Year: Never true   . Ran Out of Food in the Last Year: Never true  Transportation Needs: No Transportation Needs (11/09/2022)   PRAPARE - Transportation   . Lack of Transportation (Medical): No   . Lack of Transportation (Non-Medical): No  Physical Activity: Insufficiently Active (11/09/2022)   Exercise Vital Sign   . Days of Exercise per Week: 7 days   . Minutes of Exercise per Session: 10 min  Stress: No Stress Concern Present (11/09/2022)   [employer] of Occupational Health - Occupational Stress Questionnaire   . Feeling of Stress : Not at all  Social Connections: Moderately Integrated (11/09/2022)   Social Connection and Isolation Panel [NHANES]   . Frequency of Communication with Friends and Family: More than three times a week   . Frequency of Social Gatherings with Friends and Family: More than three times a week   . Attends Religious Services: More than 4 times per year   . Active Member of Clubs or Organizations: No   . Attends Banker Meetings: Never   . Marital Status: Married    Objective:  Pulse 81   Temp (!) 97.2 F (36.2 C)   Ht 5\' 5"  (1.651 m)   Wt 168 lb (76.2 kg)   SpO2 97%   BMI 27.96 kg/m      05/10/2023    8:06 AM 12/25/2022    8:39 AM 11/09/2022    8:06 AM  BP/Weight  Systolic BP   100  Diastolic BP   62  Wt. (Lbs) 168 176 176  BMI 27.96 kg/m2 29.29 kg/m2 29.29 kg/m2    Physical Exam Vitals reviewed.  Constitutional:      Appearance: Normal appearance. He is normal weight.  Cardiovascular:     Rate and Rhythm: Normal rate and regular rhythm.      Heart sounds: No murmur heard. Pulmonary:     Effort: Pulmonary effort is normal.     Breath sounds: Normal breath sounds.  Abdominal:     General: Abdomen is flat. Bowel sounds are normal.     Palpations: Abdomen is soft.     Tenderness: There is abdominal tenderness.  Neurological:  Mental Status: He is alert and oriented to person, place, and time.  Psychiatric:        Mood and Affect: Mood normal.        Behavior: Behavior normal.    Diabetic Foot Exam - Simple   No data filed      Lab Results  Component Value Date   WBC 7.3 11/09/2022   HGB 16.1 11/09/2022   HCT 47.8 11/09/2022   PLT 256 11/09/2022   GLUCOSE 102 (H) 11/09/2022   CHOL 193 11/09/2022   TRIG 161 (H) 11/09/2022   HDL 41 11/09/2022   LDLCALC 123 (H) 11/09/2022   ALT 11 11/09/2022   AST 21 11/09/2022   NA 140 11/09/2022   K 5.3 (H) 11/09/2022   CL 101 11/09/2022   CREATININE 1.02 11/09/2022   BUN 20 11/09/2022   CO2 23 11/09/2022   TSH 4.010 11/09/2022   HGBA1C 5.3 05/09/2022      Assessment & Plan:    Mixed hyperlipidemia Assessment & Plan: Control: poor Refuses statins. Continue Zetia 10 mg daily, and OTC Fish Oil.  Continue to work on eating a healthy diet. Labs drawn today.       No orders of the defined types were placed in this encounter.   No orders of the defined types were placed in this encounter.    Follow-up: No follow-ups on file.   I,Marla I Leal-Borjas,acting as a scribe for Blane Ohara, MD.,have documented all relevant documentation on the behalf of Blane Ohara, MD,as directed by  Blane Ohara, MD while in the presence of Blane Ohara, MD.   An After Visit Summary was printed and given to the patient.  Blane Ohara, MD [facility] [phone]

## 2023-05-09 NOTE — Assessment & Plan Note (Addendum)
Control: poor Refuses statins.  Continue to work on eating a healthy diet. No checking lipids since patient does not wish to take any medication.

## 2023-05-10 ENCOUNTER — Ambulatory Visit (INDEPENDENT_AMBULATORY_CARE_PROVIDER_SITE_OTHER): Payer: Medicare HMO | Admitting: Family Medicine

## 2023-05-10 VITALS — BP 99/65 | HR 81 | Temp 97.2°F | Ht 65.0 in | Wt 168.0 lb

## 2023-05-10 DIAGNOSIS — E782 Mixed hyperlipidemia: Secondary | ICD-10-CM | POA: Diagnosis not present

## 2023-05-10 DIAGNOSIS — G4734 Idiopathic sleep related nonobstructive alveolar hypoventilation: Secondary | ICD-10-CM | POA: Diagnosis not present

## 2023-05-10 DIAGNOSIS — J84112 Idiopathic pulmonary fibrosis: Secondary | ICD-10-CM

## 2023-05-10 DIAGNOSIS — H8103 Meniere's disease, bilateral: Secondary | ICD-10-CM | POA: Diagnosis not present

## 2023-05-10 DIAGNOSIS — J439 Emphysema, unspecified: Secondary | ICD-10-CM | POA: Diagnosis not present

## 2023-05-10 LAB — COMPREHENSIVE METABOLIC PANEL
ALT: 10 IU/L (ref 0–44)
AST: 17 IU/L (ref 0–40)
Albumin/Globulin Ratio: 1.7 (ref 1.2–2.2)
Albumin: 4.1 g/dL (ref 3.6–4.6)
Alkaline Phosphatase: 103 IU/L (ref 44–121)
BUN/Creatinine Ratio: 14 (ref 10–24)
BUN: 15 mg/dL (ref 10–36)
Bilirubin Total: 0.3 mg/dL (ref 0.0–1.2)
CO2: 24 mmol/L (ref 20–29)
Calcium: 9.3 mg/dL (ref 8.6–10.2)
Chloride: 101 mmol/L (ref 96–106)
Creatinine, Ser: 1.05 mg/dL (ref 0.76–1.27)
Globulin, Total: 2.4 g/dL (ref 1.5–4.5)
Glucose: 93 mg/dL (ref 70–99)
Potassium: 5.4 mmol/L — ABNORMAL HIGH (ref 3.5–5.2)
Sodium: 141 mmol/L (ref 134–144)
Total Protein: 6.5 g/dL (ref 6.0–8.5)
eGFR: 67 mL/min/{1.73_m2} (ref >59)

## 2023-05-10 LAB — CBC WITH DIFFERENTIAL/PLATELET
Basophils Absolute: 0.1 10*3/uL (ref 0.0–0.2)
Basos: 1 %
EOS (ABSOLUTE): 0.3 10*3/uL (ref 0.0–0.4)
Eos: 3 %
Hematocrit: 49.1 % (ref 37.5–51.0)
Hemoglobin: 16.4 g/dL (ref 13.0–17.7)
Immature Grans (Abs): 0 10*3/uL (ref 0.0–0.1)
Immature Granulocytes: 0 %
Lymphocytes Absolute: 2.5 10*3/uL (ref 0.7–3.1)
Lymphs: 29 %
MCH: 29.9 pg (ref 26.6–33.0)
MCHC: 33.4 g/dL (ref 31.5–35.7)
MCV: 90 fL (ref 79–97)
Monocytes Absolute: 0.9 10*3/uL (ref 0.1–0.9)
Monocytes: 10 %
Neutrophils Absolute: 4.9 10*3/uL (ref 1.4–7.0)
Neutrophils: 57 %
Platelets: 314 10*3/uL (ref 150–450)
RBC: 5.48 x10E6/uL (ref 4.14–5.80)
RDW: 13.2 % (ref 11.6–15.4)
WBC: 8.6 10*3/uL (ref 3.4–10.8)

## 2023-05-15 ENCOUNTER — Encounter: Payer: Self-pay | Admitting: Family Medicine

## 2023-05-15 NOTE — Assessment & Plan Note (Signed)
No treatment

## 2023-05-15 NOTE — Assessment & Plan Note (Signed)
Wear oxygen 2 L daily.

## 2023-05-15 NOTE — Assessment & Plan Note (Signed)
Stable. No meds.  Continue oxygen.

## 2023-05-17 DIAGNOSIS — I2721 Secondary pulmonary arterial hypertension: Secondary | ICD-10-CM | POA: Diagnosis not present

## 2023-05-17 DIAGNOSIS — J84112 Idiopathic pulmonary fibrosis: Secondary | ICD-10-CM | POA: Diagnosis not present

## 2023-05-17 DIAGNOSIS — J9611 Chronic respiratory failure with hypoxia: Secondary | ICD-10-CM | POA: Diagnosis not present

## 2023-05-28 DIAGNOSIS — N451 Epididymitis: Secondary | ICD-10-CM | POA: Diagnosis not present

## 2023-05-28 DIAGNOSIS — N3289 Other specified disorders of bladder: Secondary | ICD-10-CM | POA: Diagnosis not present

## 2023-05-28 DIAGNOSIS — C61 Malignant neoplasm of prostate: Secondary | ICD-10-CM | POA: Diagnosis not present

## 2023-06-19 DIAGNOSIS — I2721 Secondary pulmonary arterial hypertension: Secondary | ICD-10-CM | POA: Diagnosis not present

## 2023-06-19 DIAGNOSIS — J84112 Idiopathic pulmonary fibrosis: Secondary | ICD-10-CM | POA: Diagnosis not present

## 2023-06-19 DIAGNOSIS — J9611 Chronic respiratory failure with hypoxia: Secondary | ICD-10-CM | POA: Diagnosis not present

## 2023-06-28 DIAGNOSIS — J84112 Idiopathic pulmonary fibrosis: Secondary | ICD-10-CM | POA: Diagnosis not present

## 2023-06-28 DIAGNOSIS — J9611 Chronic respiratory failure with hypoxia: Secondary | ICD-10-CM | POA: Diagnosis not present

## 2023-06-28 DIAGNOSIS — I2721 Secondary pulmonary arterial hypertension: Secondary | ICD-10-CM | POA: Diagnosis not present

## 2023-07-01 DIAGNOSIS — H353222 Exudative age-related macular degeneration, left eye, with inactive choroidal neovascularization: Secondary | ICD-10-CM | POA: Diagnosis not present

## 2023-09-23 DIAGNOSIS — J84112 Idiopathic pulmonary fibrosis: Secondary | ICD-10-CM | POA: Diagnosis not present

## 2023-09-23 DIAGNOSIS — I2721 Secondary pulmonary arterial hypertension: Secondary | ICD-10-CM | POA: Diagnosis not present

## 2023-09-23 DIAGNOSIS — J9611 Chronic respiratory failure with hypoxia: Secondary | ICD-10-CM | POA: Diagnosis not present

## 2023-10-15 ENCOUNTER — Ambulatory Visit (INDEPENDENT_AMBULATORY_CARE_PROVIDER_SITE_OTHER): Payer: Medicare HMO | Admitting: Family Medicine

## 2023-10-15 ENCOUNTER — Encounter: Payer: Self-pay | Admitting: Family Medicine

## 2023-10-15 VITALS — BP 108/64 | HR 77 | Temp 98.0°F | Ht 65.0 in | Wt 158.0 lb

## 2023-10-15 DIAGNOSIS — J84112 Idiopathic pulmonary fibrosis: Secondary | ICD-10-CM | POA: Diagnosis not present

## 2023-10-15 DIAGNOSIS — H6123 Impacted cerumen, bilateral: Secondary | ICD-10-CM | POA: Diagnosis not present

## 2023-10-15 DIAGNOSIS — Z23 Encounter for immunization: Secondary | ICD-10-CM | POA: Diagnosis not present

## 2023-10-15 DIAGNOSIS — R0602 Shortness of breath: Secondary | ICD-10-CM | POA: Insufficient documentation

## 2023-10-15 NOTE — Assessment & Plan Note (Signed)
Stable. Shortness of breath and pursed lip breathing is baseline for patient. O2 saturation - 93% -Continue Oxygen as needed for O2 saturation less than 93%

## 2023-10-15 NOTE — Assessment & Plan Note (Signed)
removal of ear wax with instrumentation and irrigation - patient tolerated well

## 2023-10-15 NOTE — Progress Notes (Signed)
Acute Office Visit  Subjective:    Patient ID: Dalton Flores, male    DOB: 06/08/1929, 89 y.o.   MRN: [id number]  Chief Complaint  Patient presents with   Hearing Problem    HPI: Patient is in today for hearing problems.  It was recommended by the hearing aid company that he come in to have his ears cleaned out.  Past Medical History:  Diagnosis Date   Emphysema lung (HCC)    Hearing loss    Hypothyroid    Macular degeneration    Meniere disease    Prostate cancer (HCC) 2008   Pulmonary fibrosis [facility])     Past Surgical History:  Procedure Laterality Date   APPENDECTOMY  1946   BACK SURGERY  2005   BLADDER SURGERY  12/16/2019    cystoscopy internal urethrotomy with balloon and holmium and bilateral retrograde pyelograms with dilitation of stricture.    CATARACT EXTRACTION, BILATERAL     CHOLECYSTECTOMY  08/2016   HERNIA REPAIR  1978   LAMINECTOMY     PROSTATECTOMY  2005   TONSILLECTOMY      Family History  Problem Relation Age of Onset   Heart disease Mother    Emphysema Father    Coronary artery disease Father    CVA Father    Brain cancer Sister    Cancer Sister    Diabetes Sister    Diabetes Sister    Parkinson's disease Brother    Heart attack Brother    COPD Brother    Heart attack Brother    Parkinson's disease Brother    Diabetes Brother     Social History   Socioeconomic History   Marital status: Married    Spouse name: Not on file   Number of children: Not on file   Years of education: Not on file   Highest education level: Not on file  Occupational History   Occupation: retired  Tobacco Use   Smoking status: Never   Smokeless tobacco: Never  Vaping Use   Vaping status: Never Used  Substance and Sexual Activity   Alcohol use: No   Drug use: No   Sexual activity: Not on file  Other Topics Concern   Not on file  Social History Narrative   Not on file   Social Determinants of Health   Financial Resource Strain: Not on file   Food Insecurity: No Food Insecurity (11/09/2022)   Hunger Vital Sign    Worried About Running Out of Food in the Last Year: Never true    Ran Out of Food in the Last Year: Never true  Transportation Needs: No Transportation Needs (11/09/2022)   PRAPARE - Administrator, Civil Service (Medical): No    Lack of Transportation (Non-Medical): No  Physical Activity: Insufficiently Active (11/09/2022)   Exercise Vital Sign    Days of Exercise per Week: 7 days    Minutes of Exercise per Session: 10 min  Stress: No Stress Concern Present (11/09/2022)   [employer] of Occupational Health - Occupational Stress Questionnaire    Feeling of Stress : Not at all  Social Connections: Moderately Integrated (11/09/2022)   Social Connection and Isolation Panel [NHANES]    Frequency of Communication with Friends and Family: More than three times a week    Frequency of Social Gatherings with Friends and Family: More than three times a week    Attends Religious Services: More than 4 times per year    Active Member  of Clubs or Organizations: No    Attends Banker Meetings: Never    Marital Status: Married  Catering manager Violence: Not At Risk (11/09/2022)   Humiliation, Afraid, Rape, and Kick questionnaire    Fear of Current or Ex-Partner: No    Emotionally Abused: No    Physically Abused: No    Sexually Abused: No    Outpatient Medications Prior to Visit  Medication Sig Dispense Refill   Multiple Vitamins-Minerals (PRESERVISION AREDS PO) Take by mouth. (Patient not taking: Reported on 10/15/2023)     ondansetron (ZOFRAN) 4 MG tablet Take 1 tablet (4 mg total) by mouth every 8 (eight) hours as needed for nausea or vomiting. (Patient not taking: Reported on 11/09/2022) 20 tablet 3   tamsulosin (FLOMAX) 0.4 MG CAPS capsule Take 0.4 mg by mouth daily. (Patient not taking: Reported on 10/15/2023)     No facility-administered medications prior to visit.    Allergies  Allergen  Reactions   Hydrochlorothiazide Other (See Comments)    Dizziness    Nitrofurantoin Rash   Penicillins Rash    Review of Systems  Constitutional:  Negative for fever.  HENT:  Positive for hearing loss. Negative for sore throat.   Respiratory:  Positive for shortness of breath. Negative for cough.   Gastrointestinal: Negative.   Genitourinary: Negative.        Objective:        10/15/2023    3:57 PM 05/10/2023    8:15 AM 05/10/2023    8:06 AM  Vitals with BMI  Height 5\' 5"   5\' 5"   Weight 158 lbs  168 lbs  BMI 26.29  27.96  Systolic 108 99 94  Diastolic 64 65 72  Pulse 77  81    No data found.   Physical Exam Constitutional:      General: He is not in acute distress.    Appearance: Normal appearance.  HENT:     Right Ear: Decreased hearing noted. There is impacted cerumen.     Left Ear: Decreased hearing noted. There is impacted cerumen.     Mouth/Throat:     Pharynx: Posterior oropharyngeal erythema present.  Cardiovascular:     Rate and Rhythm: Normal rate and regular rhythm.     Heart sounds: Normal heart sounds.  Pulmonary:     Breath sounds: Rhonchi present.     Comments: Pursed lip breathing Abdominal:     General: Bowel sounds are normal.     Palpations: Abdomen is soft.     Tenderness: There is no abdominal tenderness.  Neurological:     Mental Status: He is alert. Mental status is at baseline.  Psychiatric:        Mood and Affect: Mood normal.     Health Maintenance Due  Topic Date Due   DTaP/Tdap/Td (1 - Tdap) Never done   Zoster Vaccines- Shingrix (1 of 2) Never done   COVID-19 Vaccine (5 - 2023-24 season) 08/04/2023   Medicare Annual Wellness (AWV)  11/10/2023    There are no preventive care reminders to display for this patient.   Lab Results  Component Value Date   TSH 4.010 11/09/2022   Lab Results  Component Value Date   WBC 8.6 05/10/2023   HGB 16.4 05/10/2023   HCT 49.1 05/10/2023   MCV 90 05/10/2023   PLT 314 05/10/2023    Lab Results  Component Value Date   NA 141 05/10/2023   K 5.4 (H) 05/10/2023   CO2 24 05/10/2023  GLUCOSE 93 05/10/2023   BUN 15 05/10/2023   CREATININE 1.05 05/10/2023   BILITOT 0.3 05/10/2023   ALKPHOS 103 05/10/2023   AST 17 05/10/2023   ALT 10 05/10/2023   PROT 6.5 05/10/2023   ALBUMIN 4.1 05/10/2023   CALCIUM 9.3 05/10/2023   EGFR 67 05/10/2023   GFR 63.42 06/20/2021   Lab Results  Component Value Date   CHOL 193 11/09/2022   Lab Results  Component Value Date   HDL 41 11/09/2022   Lab Results  Component Value Date   LDLCALC 123 (H) 11/09/2022   Lab Results  Component Value Date   TRIG 161 (H) 11/09/2022   Lab Results  Component Value Date   CHOLHDL 4.7 11/09/2022   Lab Results  Component Value Date   HGBA1C 5.3 05/09/2022       Assessment & Plan:  Cerumen debris on tympanic membrane of both ears Assessment & Plan: removal of ear wax with instrumentation and irrigation - patient tolerated well   IPF (idiopathic pulmonary fibrosis) (HCC) Assessment & Plan: Stable. Shortness of breath and pursed lip breathing is baseline for patient. O2 saturation - 93% -Continue Oxygen as needed for O2 saturation less than 93%    Need for vaccination -     Flu Vaccine Trivalent High Dose (Fluad)      Follow-up: Return in about 1 month (around 11/14/2023), or keep appt with Dr, Sedalia Muta, for chronic.  An After Visit Summary was printed and given to the patient.  Total time spent on today's visit was greater than 30 minutes, including both face-to-face time and nonface-to-face time personally spent on review of chart (labs and imaging), discussing labs and goals, discussing further work-up, treatment options, referrals to specialist if needed, reviewing outside records of pertinent, answering patient's questions, and coordinating care.   Lajuana Matte, FNP [facility] [phone]

## 2023-11-08 ENCOUNTER — Ambulatory Visit (INDEPENDENT_AMBULATORY_CARE_PROVIDER_SITE_OTHER): Payer: Medicare HMO

## 2023-11-08 VITALS — BP 102/64 | HR 67 | Temp 97.8°F | Resp 20 | Ht 65.0 in | Wt 171.0 lb

## 2023-11-08 DIAGNOSIS — Z0000 Encounter for general adult medical examination without abnormal findings: Secondary | ICD-10-CM

## 2023-11-08 DIAGNOSIS — H8103 Meniere's disease, bilateral: Secondary | ICD-10-CM

## 2023-11-08 DIAGNOSIS — J84112 Idiopathic pulmonary fibrosis: Secondary | ICD-10-CM | POA: Diagnosis not present

## 2023-11-08 DIAGNOSIS — E782 Mixed hyperlipidemia: Secondary | ICD-10-CM

## 2023-11-08 NOTE — Assessment & Plan Note (Signed)
Chronic condition with persistent dyspnea. On 2L oxygen at home and during ambulation. Not on medications due to adverse effects (diarrhea). Managed by pulmonologist. Current management focuses on symptom control. - Continue current oxygen therapy - Monitor for changes in symptoms such as increased dyspnea or falls - Reassess in three months for potential blood work

## 2023-11-08 NOTE — Patient Instructions (Addendum)
No blood work today Continue using oxygen as needed Covid vaccine today. Please get the TDAP, shingles vaccine and RSV vaccine at your pharmacy Come back in March for follow up and blood work.

## 2023-11-08 NOTE — Assessment & Plan Note (Signed)
Fall Risk Recent fall due to rapid ambulation. No injuries. Frequent staggering. Discussed fall prevention strategies. - Monitor for further falls - Implement fall prevention strategies at home  Hearing Loss Difficulty hearing, especially unilaterally. - Ensure communication is directed towards the better hearing side  Heartburn Intermittent heartburn managed with occasional use of Prilosec. Triggered by certain foods. Well-controlled with current regimen. - Continue current medication as needed for heartburn  Dry Skin Severe xerosis on legs requiring moisturizer. Discussed regular application to prevent complications. - Apply moisturizer to legs regularly  Social and Functional Status Lives alone, manages daily activities including meal preparation. Family provides support and is considering additional help. Discussed potential need for increased support or home health services if condition changes. - Continue current family support - Monitor for changes in ability to manage daily activities  General Health Maintenance Due for several vaccinations. Discussed importance and schedule for follow-up doses. - Administer COVID vaccine in office -  first dose of shingles vaccine- TO REQUEST PHARMACY TO ADMINISTER DUE TO INSURANCE COVERAGE ISSUES. - tetanus vaccine- TO REQUEST PHARMACY TO ADMINISTER DUE TO INSURANCE COVERAGE ISSUES.  - Schedule follow-up visit in March or April for blood work.

## 2023-11-08 NOTE — Progress Notes (Signed)
Subjective:  Patient ID: Dalton Flores, male    DOB: 12-17-1928  Age: 89 y.o. MRN: [id number]  Chief Complaint  Patient presents with   Medical Management of Chronic Issues    HPI  The patient, a 89 year old with a history of pulmonary fibrosis, presents with persistent dyspnea. Accompanied by his daughter. Due to his extreme hard of hearing, she is also the primary historian. He reports not feeling well due to difficulty breathing. The patient uses oxygen at home, primarily during sleep and ambulation, but does not use it consistently throughout the day. He previously tried medication for pulmonary fibrosis, but discontinued due to severe diarrhea. The patient denies taking any other medications, except for occasional heartburn relief, which is triggered by certain foods.  The patient has also been experiencing issues with balance, reporting a recent fall last week due to getting up and walking too quickly. No injuries were reported from the fall. The patient also reports unpredictable bowel movements, but manages to maintain personal hygiene independently.  The patient lives alone following the recent passing of his spouse. He manages basic meal preparations, such as boiling or frying an egg and using the microwave. Family members assist with meals and check-ins periodically. The patient has expressed a strong preference to remain at home rather than moving to a care facility.  The patient has a history of physical labor, evidenced by a missing left ring finger and a partially amputated right index finger. He also has a non-functional left little finger. Despite these injuries, the patient denies any current pain. Meniere's disease:  Dr Lenoria Farrier, ENT. No other treatments to offer.  Pulmonary fibrosis: Has changed to Dr. Blenda Nicely. Intolerant to ofev (diarrhea).  Patient currently uses 2 L of oxygen at night and with exertion. Not on Esbriet since March. Tolerating pretty well    Hyperlipidemia: Patient is not taking any medication  Diet: not on diet.   Exercise: Unable to do much exertional. Can still use his riding mower.   Macular Degeneration: No longer getting injections     05/10/2023    8:13 AM 11/09/2022    8:15 AM 11/16/2021    7:36 PM 11/07/2021    8:34 AM 07/31/2021    8:53 AM  Depression screen PHQ 2/9  Decreased Interest 0 0 0 0 0  Down, Depressed, Hopeless 0 0 0 0 0  PHQ - 2 Score 0 0 0 0 0        05/10/2023    8:12 AM  Fall Risk   Falls in the past year? 1  Number falls in past yr: 1  Injury with Fall? 0  Risk for fall due to : Impaired balance/gait  Follow up Falls evaluation completed    Patient Care Team: Blane Ohara, MD as PCP - General (Family Medicine) Debroah Baller, MD as Consulting Physician (Urology) Lenoria Farrier Lanny Cramp, MD as Referring Physician (Otolaryngology) Marcellus Scott, MD as Referring Physician (Specialist)   Review of Systems  Constitutional: Negative.   HENT: Negative.    Respiratory:  Positive for shortness of breath.     No current outpatient medications on file prior to visit.   No current facility-administered medications on file prior to visit.   Past Medical History:  Diagnosis Date   Emphysema lung (HCC)    Hearing loss    Hypothyroid    Macular degeneration    Meniere disease    Prostate cancer (HCC) 2008   Pulmonary fibrosis [facility])    Past Surgical  History:  Procedure Laterality Date   APPENDECTOMY  1946   BACK SURGERY  2005   BLADDER SURGERY  12/16/2019    cystoscopy internal urethrotomy with balloon and holmium and bilateral retrograde pyelograms with dilitation of stricture.    CATARACT EXTRACTION, BILATERAL     CHOLECYSTECTOMY  08/2016   HERNIA REPAIR  1978   LAMINECTOMY     PROSTATECTOMY  2005   TONSILLECTOMY      Family History  Problem Relation Age of Onset   Heart disease Mother    Emphysema Father    Coronary artery disease Father    CVA Father    Brain  cancer Sister    Cancer Sister    Diabetes Sister    Diabetes Sister    Parkinson's disease Brother    Heart attack Brother    COPD Brother    Heart attack Brother    Parkinson's disease Brother    Diabetes Brother    Social History   Socioeconomic History   Marital status: Married    Spouse name: Not on file   Number of children: Not on file   Years of education: Not on file   Highest education level: Not on file  Occupational History   Occupation: retired  Tobacco Use   Smoking status: Never   Smokeless tobacco: Never  Vaping Use   Vaping status: Never Used  Substance and Sexual Activity   Alcohol use: No   Drug use: No   Sexual activity: Not on file  Other Topics Concern   Not on file  Social History Narrative   Not on file   Social Determinants of Health   Financial Resource Strain: Not on file  Food Insecurity: No Food Insecurity (11/09/2022)   Hunger Vital Sign    Worried About Running Out of Food in the Last Year: Never true    Ran Out of Food in the Last Year: Never true  Transportation Needs: No Transportation Needs (11/09/2022)   PRAPARE - Administrator, Civil Service (Medical): No    Lack of Transportation (Non-Medical): No  Physical Activity: Insufficiently Active (11/09/2022)   Exercise Vital Sign    Days of Exercise per Week: 7 days    Minutes of Exercise per Session: 10 min  Stress: No Stress Concern Present (11/09/2022)   [employer] of Occupational Health - Occupational Stress Questionnaire    Feeling of Stress : Not at all  Social Connections: Moderately Integrated (11/09/2022)   Social Connection and Isolation Panel [NHANES]    Frequency of Communication with Friends and Family: More than three times a week    Frequency of Social Gatherings with Friends and Family: More than three times a week    Attends Religious Services: More than 4 times per year    Active Member of [employer] or Organizations: No    Attends Museum/gallery exhibitions officer: Never    Marital Status: Married    Objective:  BP 102/64 (BP Location: Left Arm, Patient Position: Sitting, Cuff Size: Large)   Pulse 67   Temp 97.8 F (36.6 C) (Temporal)   Resp 20   Ht 5\' 5"  (1.651 m)   Wt 171 lb (77.6 kg)   SpO2 94%   BMI 28.46 kg/m      11/08/2023    8:47 AM 10/15/2023    3:57 PM 05/10/2023    8:15 AM  BP/Weight  Systolic BP 102 108 99  Diastolic BP 64 64 65  Wt. (Lbs)  171 158   BMI 28.46 kg/m2 26.29 kg/m2     Physical Exam Vitals and nursing note reviewed.  Constitutional:      Comments: HEENT:  throat normal, dentures present. CHEST: extensive Crackles in lungs bilaterally CARDIOVASCULAR: Heart sounds strong. ABDOMEN: Abdomen non-tender on palpation. EXTREMITIES: Legs very dry, lotion application recommended. MUSCULOSKELETAL: Left ring finger missing, left little finger partially amputated, right index finger partially amputated, unable to straighten left little finger.  Neurological:     Mental Status: He is alert.     Diabetic Foot Exam - Simple   No data filed      Lab Results  Component Value Date   WBC 8.6 05/10/2023   HGB 16.4 05/10/2023   HCT 49.1 05/10/2023   PLT 314 05/10/2023   GLUCOSE 93 05/10/2023   CHOL 193 11/09/2022   TRIG 161 (H) 11/09/2022   HDL 41 11/09/2022   LDLCALC 123 (H) 11/09/2022   ALT 10 05/10/2023   AST 17 05/10/2023   NA 141 05/10/2023   K 5.4 (H) 05/10/2023   CL 101 05/10/2023   CREATININE 1.05 05/10/2023   BUN 15 05/10/2023   CO2 24 05/10/2023   TSH 4.010 11/09/2022   HGBA1C 5.3 05/09/2022      Assessment & Plan:    Mixed hyperlipidemia Assessment & Plan: Not on medication. Does not wish to be. I do not see the need for blood work today.,   Meniere's disease of both ears Assessment & Plan: No medication. No active concern at this time   IPF (idiopathic pulmonary fibrosis) (HCC) Assessment & Plan: Chronic condition with persistent dyspnea. On 2L oxygen at  home and during ambulation. Not on medications due to adverse effects (diarrhea). Managed by pulmonologist. Current management focuses on symptom control. - Continue current oxygen therapy - Monitor for changes in symptoms such as increased dyspnea or falls - Reassess in three months for potential blood work    Healthcare maintenance Assessment & Plan: Fall Risk Recent fall due to rapid ambulation. No injuries. Frequent staggering. Discussed fall prevention strategies. - Monitor for further falls - Implement fall prevention strategies at home  Hearing Loss Difficulty hearing, especially unilaterally. - Ensure communication is directed towards the better hearing side  Heartburn Intermittent heartburn managed with occasional use of Prilosec. Triggered by certain foods. Well-controlled with current regimen. - Continue current medication as needed for heartburn  Dry Skin Severe xerosis on legs requiring moisturizer. Discussed regular application to prevent complications. - Apply moisturizer to legs regularly  Social and Functional Status Lives alone, manages daily activities including meal preparation. Family provides support and is considering additional help. Discussed potential need for increased support or home health services if condition changes. - Continue current family support - Monitor for changes in ability to manage daily activities  General Health Maintenance Due for several vaccinations. Discussed importance and schedule for follow-up doses. - Administer COVID vaccine in office -  first dose of shingles vaccine- TO REQUEST PHARMACY TO ADMINISTER DUE TO INSURANCE COVERAGE ISSUES. - tetanus vaccine- TO REQUEST PHARMACY TO ADMINISTER DUE TO INSURANCE COVERAGE ISSUES.  - Schedule follow-up visit in March or April for blood work.      No orders of the defined types were placed in this encounter.   No orders of the defined types were placed in this encounter.     Follow-up: Return in about 3 months (around 02/06/2024).   I,Marla I Leal-Borjas,acting as a scribe for [employer], MD.,have documented all relevant documentation on  the behalf of Windell Moment, MD,as directed by  Windell Moment, MD while in the presence of Windell Moment, MD.   An After Visit Summary was printed and given to the patient.  Windell Moment, MD [facility] [phone]

## 2023-11-08 NOTE — Assessment & Plan Note (Signed)
No medication. No active concern at this time

## 2023-11-08 NOTE — Assessment & Plan Note (Signed)
Not on medication. Does not wish to be. I do not see the need for blood work today.,

## 2023-11-21 ENCOUNTER — Ambulatory Visit: Payer: Medicare HMO | Admitting: Family Medicine

## 2023-11-21 ENCOUNTER — Encounter: Payer: Self-pay | Admitting: Family Medicine

## 2023-11-21 VITALS — BP 95/58 | HR 74 | Ht 65.0 in | Wt 171.0 lb

## 2023-11-21 DIAGNOSIS — Z0000 Encounter for general adult medical examination without abnormal findings: Secondary | ICD-10-CM

## 2023-11-21 NOTE — Progress Notes (Signed)
Subjective:   Dalton Flores is a 89 y.o. male who presents for Medicare Annual/Subsequent preventive examination.  Visit Complete: Virtual I connected with  Christell Constant Biller on 11/21/2023 by a audio enabled telemedicine application and verified that I am speaking with the correct person using two identifiers.  Patient Location: Home  Provider Location: Office/Clinic  I discussed the limitations of evaluation and management by telemedicine. The patient expressed understanding and agreed to proceed.  Vital Signs: Because this visit was a virtual/telehealth visit, some criteria may be missing or patient reported. Any vitals not documented were not able to be obtained and vitals that have been documented are patient reported.  Patient Medicare AWV questionnaire was completed by the patient on 11/21/2023; I have confirmed that all information answered by patient is correct and no changes since this date.      Objective:    Today's Vitals   11/21/23 1119  Weight: 171 lb (77.6 kg)  Height: 5\' 5"  (1.651 m)  PainSc: 0-No pain   Body mass index is 28.46 kg/m.     11/09/2022    9:13 AM 11/16/2021    7:35 PM 10/18/2020    3:12 PM  Advanced Directives  Does Patient Have a Medical Advance Directive? No No No  Would patient like information on creating a medical advance directive? Yes (MAU/Ambulatory/Procedural Areas - Information given) No - Patient declined     Current Medications (verified) Outpatient Encounter Medications as of 11/21/2023  Medication Sig   famotidine (PEPCID) 40 MG tablet Take 40 mg by mouth daily.   No facility-administered encounter medications on file as of 11/21/2023.    Allergies (verified) Hydrochlorothiazide, Nitrofurantoin, and Penicillins   History: Past Medical History:  Diagnosis Date   Emphysema lung (HCC)    Hearing loss    Hypothyroid    Macular degeneration    Meniere disease    Prostate cancer (HCC) 2008   Pulmonary fibrosis  (HCC)    Past Surgical History:  Procedure Laterality Date   APPENDECTOMY  1946   BACK SURGERY  2005   BLADDER SURGERY  12/16/2019    cystoscopy internal urethrotomy with balloon and holmium and bilateral retrograde pyelograms with dilitation of stricture.    CATARACT EXTRACTION, BILATERAL     CHOLECYSTECTOMY  08/2016   HERNIA REPAIR  1978   LAMINECTOMY     PROSTATECTOMY  2005   TONSILLECTOMY     Family History  Problem Relation Age of Onset   Heart disease Mother    Emphysema Father    Coronary artery disease Father    CVA Father    Brain cancer Sister    Cancer Sister    Diabetes Sister    Diabetes Sister    Parkinson's disease Brother    Heart attack Brother    COPD Brother    Heart attack Brother    Parkinson's disease Brother    Diabetes Brother    Social History   Socioeconomic History   Marital status: Married    Spouse name: Not on file   Number of children: Not on file   Years of education: Not on file   Highest education level: Not on file  Occupational History   Occupation: retired  Tobacco Use   Smoking status: Never   Smokeless tobacco: Never  Vaping Use   Vaping status: Never Used  Substance and Sexual Activity   Alcohol use: No   Drug use: No   Sexual activity: Not on file  Other  Topics Concern   Not on file  Social History Narrative   Not on file   Social Drivers of Health   Financial Resource Strain: Not on file  Food Insecurity: No Food Insecurity (11/09/2022)   Hunger Vital Sign    Worried About Running Out of Food in the Last Year: Never true    Ran Out of Food in the Last Year: Never true  Transportation Needs: No Transportation Needs (11/09/2022)   PRAPARE - Administrator, Civil Service (Medical): No    Lack of Transportation (Non-Medical): No  Physical Activity: Insufficiently Active (11/09/2022)   Exercise Vital Sign    Days of Exercise per Week: 7 days    Minutes of Exercise per Session: 10 min  Stress: No Stress  Concern Present (11/09/2022)   [employer] of Occupational Health - Occupational Stress Questionnaire    Feeling of Stress : Not at all  Social Connections: Moderately Integrated (11/09/2022)   Social Connection and Isolation Panel [NHANES]    Frequency of Communication with Friends and Family: More than three times a week    Frequency of Social Gatherings with Friends and Family: More than three times a week    Attends Religious Services: More than 4 times per year    Active Member of Clubs or Organizations: No    Attends Banker Meetings: Never    Marital Status: Married    Tobacco Counseling Counseling given: Not Answered   Clinical Intake:  Pre-visit preparation completed: No  Pain : No/denies pain Pain Score: 0-No pain     BMI - recorded: 28.46 Nutritional Status: BMI 25 -29 Overweight Nutritional Risks: None Diabetes: No How often do you need to have someone help you when you read instructions, pamphlets, or other written materials from your doctor or pharmacy?: 1 - Never Interpreter Needed?: No    Activities of Daily Living     No data to display          Patient Care Team: Blane Ohara, MD as PCP - General (Family Medicine) Debroah Baller, MD as Consulting Physician (Urology) Cheral Bay, MD as Referring Physician (Otolaryngology) Marcellus Scott, MD as Referring Physician (Specialist)  Indicate any recent Medical Services you may have received from other than [facility] providers in the past year (date may be approximate).     Assessment:   This is a routine wellness examination for [place].  Hearing/Vision screen No results found.  Goals: get to feeling better  Depression Screen    11/21/2023   11:27 AM 05/10/2023    8:13 AM 11/09/2022    8:15 AM 11/16/2021    7:36 PM 11/07/2021    8:34 AM 07/31/2021    8:53 AM 01/30/2021    8:51 AM  PHQ 2/9 Scores  PHQ - 2 Score 3 0 0 0 0 0 0  PHQ- 9 Score 10          Fall  Risk    11/21/2023   11:23 AM 05/10/2023    8:12 AM 11/09/2022    8:14 AM 11/16/2021    7:35 PM 11/07/2021    8:34 AM  Fall Risk   Falls in the past year? 1 1 0 1 1  Number falls in past yr: 1 1 0 1 0  Injury with Fall? 0 0 0 0 0  Risk for fall due to : History of fall(s);Impaired balance/gait;Impaired vision;Impaired mobility Impaired balance/gait Impaired balance/gait;History of fall(s) History of fall(s);Impaired balance/gait   Follow  up Education provided Falls evaluation completed Falls evaluation completed Falls evaluation completed;Falls prevention discussed Falls evaluation completed    MEDICARE RISK AT HOME: Medicare Risk at Home Any stairs in or around the home?: Yes If so, are there any without handrails?: No Home free of loose throw rugs in walkways, pet beds, electrical cords, etc?: Yes Adequate lighting in your home to reduce risk of falls?: Yes Life alert?: Yes Use of a cane, walker or w/c?: Yes (doesn't use it like he should) Grab bars in the bathroom?: Yes Shower chair or bench in shower?: Yes Elevated toilet seat or a handicapped toilet?: No  TIMED UP AND GO:  Was the test performed?  No    Cognitive Function:        11/21/2023   11:35 AM 11/09/2022    9:19 AM 11/16/2021    7:37 PM 10/18/2020    3:19 PM  6CIT Screen  What Year? 0 points 0 points 0 points 0 points  What month? 0 points 0 points 0 points 0 points  What time? 0 points 0 points 0 points 0 points  Count back from 20 0 points 2 points 0 points 0 points  Months in reverse 2 points 2 points 0 points 0 points  Repeat phrase 6 points  0 points 0 points  Total Score 8 points  0 points 0 points    Immunizations Immunization History  Administered Date(s) Administered   Fluad Quad(high Dose 65+) 08/23/2020, 07/31/2021, 11/09/2022   Fluad Trivalent(High Dose 65+) 10/15/2023   Influenza, High Dose Seasonal PF 08/21/2017   Influenza,inj,Quad PF,6+ Mos 09/02/2016   Moderna SARS-COV2 Booster  Vaccination 07/31/2021   Moderna Sars-Covid-2 Vaccination 01/16/2020, 02/13/2020, 08/23/2020   PNEUMOCOCCAL CONJUGATE-20 11/07/2021   Pfizer(Comirnaty)Fall Seasonal Vaccine 12 years and older 11/09/2022   Pneumococcal Conjugate-13 01/05/2019   Pneumococcal Polysaccharide-23 11/18/2013    TDAP status: Due, Education has been provided regarding the importance of this vaccine. Advised may receive this vaccine at local pharmacy or Health Dept. Aware to provide a copy of the vaccination record if obtained from local pharmacy or Health Dept. Verbalized acceptance and understanding.  Flu Vaccine status: Up to date  Pneumococcal vaccine status: Up to date  Covid-19 vaccine status: Completed vaccines  Qualifies for Shingles Vaccine? Yes   Zostavax completed No   Shingrix Completed?: No.    Education has been provided regarding the importance of this vaccine. Patient has been advised to call insurance company to determine out of pocket expense if they have not yet received this vaccine. Advised may also receive vaccine at local pharmacy or Health Dept. Verbalized acceptance and understanding.  Screening Tests Health Maintenance  Topic Date Due   DTaP/Tdap/Td (1 - Tdap) Never done   Zoster Vaccines- Shingrix (1 of 2) Never done   COVID-19 Vaccine (5 - 2024-25 season) 08/04/2023   Medicare Annual Wellness (AWV)  11/20/2024   Pneumonia Vaccine 70+ Years old  Completed   INFLUENZA VACCINE  Completed   HPV VACCINES  Aged Out   Colonoscopy  Discontinued    Health Maintenance  Health Maintenance Due  Topic Date Due   DTaP/Tdap/Td (1 - Tdap) Never done   Zoster Vaccines- Shingrix (1 of 2) Never done   COVID-19 Vaccine (5 - 2024-25 season) 08/04/2023    Colorectal cancer screening: No longer required.   Lung Cancer Screening: (Low Dose CT Chest recommended if Age 35-80 years, 20 pack-year currently smoking OR have quit w/in 15years.) does not qualify.  Additional  Screening:  Hepatitis C Screening: does not qualify;   Vision Screening: Recommended annual ophthalmology exams for early detection of glaucoma and other disorders of the eye. Is the patient up to date with their annual eye exam?  Yes  Who is the provider or what is the name of the office in which the patient attends annual eye exams? [facility]  If pt is not established with a provider, would they like to be referred to a provider to establish care? No .   Dental Screening: Recommended annual dental exams for proper oral hygiene   Community Resource Referral / Chronic Care Management: CRR required this visit?  No   CCM required this visit?  No    Plan:     I have personally reviewed and noted the following in the patient's chart:   Medical and social history Use of alcohol, tobacco or illicit drugs  Current medications and supplements including opioid prescriptions. Patient is not currently taking opioid prescriptions. Functional ability and status Nutritional status Physical activity Advanced directives List of other physicians Hospitalizations, surgeries, and ER visits in previous 12 months Vitals Screenings to include cognitive, depression, and falls Referrals and appointments  In addition, I have reviewed and discussed with patient certain preventive protocols, quality metrics, and best practice recommendations. A written personalized care plan for preventive services as well as general preventive health recommendations were provided to patient.    Lajuana Matte, FNP [facility] [phone]   11/21/2023   After Visit Summary: (MyChart) Due to this being a telephonic visit, the after visit summary with patients personalized plan was offered to patient via MyChart

## 2023-11-29 DIAGNOSIS — J84112 Idiopathic pulmonary fibrosis: Secondary | ICD-10-CM | POA: Diagnosis not present

## 2023-12-02 DIAGNOSIS — J84112 Idiopathic pulmonary fibrosis: Secondary | ICD-10-CM | POA: Diagnosis not present

## 2023-12-17 DIAGNOSIS — I2722 Pulmonary hypertension due to left heart disease: Secondary | ICD-10-CM | POA: Diagnosis not present

## 2023-12-24 DIAGNOSIS — R918 Other nonspecific abnormal finding of lung field: Secondary | ICD-10-CM | POA: Diagnosis not present

## 2023-12-24 DIAGNOSIS — I2721 Secondary pulmonary arterial hypertension: Secondary | ICD-10-CM | POA: Diagnosis not present

## 2023-12-24 DIAGNOSIS — J84112 Idiopathic pulmonary fibrosis: Secondary | ICD-10-CM | POA: Diagnosis not present

## 2023-12-24 DIAGNOSIS — J9611 Chronic respiratory failure with hypoxia: Secondary | ICD-10-CM | POA: Diagnosis not present

## 2023-12-31 ENCOUNTER — Other Ambulatory Visit: Payer: Self-pay

## 2023-12-31 ENCOUNTER — Ambulatory Visit (INDEPENDENT_AMBULATORY_CARE_PROVIDER_SITE_OTHER): Payer: Medicare HMO

## 2023-12-31 VITALS — BP 90/60 | HR 88 | Temp 97.1°F | Resp 18 | Ht 64.0 in | Wt 175.0 lb

## 2023-12-31 DIAGNOSIS — H9193 Unspecified hearing loss, bilateral: Secondary | ICD-10-CM | POA: Diagnosis not present

## 2023-12-31 DIAGNOSIS — H8103 Meniere's disease, bilateral: Secondary | ICD-10-CM

## 2023-12-31 DIAGNOSIS — J84112 Idiopathic pulmonary fibrosis: Secondary | ICD-10-CM

## 2023-12-31 DIAGNOSIS — M79662 Pain in left lower leg: Secondary | ICD-10-CM | POA: Diagnosis not present

## 2023-12-31 DIAGNOSIS — Z111 Encounter for screening for respiratory tuberculosis: Secondary | ICD-10-CM | POA: Diagnosis not present

## 2023-12-31 DIAGNOSIS — Z789 Other specified health status: Secondary | ICD-10-CM | POA: Diagnosis not present

## 2023-12-31 DIAGNOSIS — M79661 Pain in right lower leg: Secondary | ICD-10-CM

## 2023-12-31 LAB — CBC WITH DIFFERENTIAL/PLATELET
Basophils Absolute: 0.1 10*3/uL (ref 0.0–0.2)
Basos: 1 %
EOS (ABSOLUTE): 0.4 10*3/uL (ref 0.0–0.4)
Eos: 4 %
Hematocrit: 44.8 % (ref 37.5–51.0)
Hemoglobin: 14.7 g/dL (ref 13.0–17.7)
Immature Grans (Abs): 0.1 10*3/uL (ref 0.0–0.1)
Immature Granulocytes: 1 %
Lymphocytes Absolute: 1.6 10*3/uL (ref 0.7–3.1)
Lymphs: 13 %
MCH: 29.5 pg (ref 26.6–33.0)
MCHC: 32.8 g/dL (ref 31.5–35.7)
MCV: 90 fL (ref 79–97)
Monocytes Absolute: 1.1 10*3/uL — ABNORMAL HIGH (ref 0.1–0.9)
Monocytes: 9 %
Neutrophils Absolute: 8.8 10*3/uL — ABNORMAL HIGH (ref 1.4–7.0)
Neutrophils: 72 %
Platelets: 304 10*3/uL (ref 150–450)
RBC: 4.98 x10E6/uL (ref 4.14–5.80)
RDW: 13.9 % (ref 11.6–15.4)
WBC: 12.1 10*3/uL — ABNORMAL HIGH (ref 3.4–10.8)

## 2023-12-31 LAB — COMPREHENSIVE METABOLIC PANEL
ALT: 23 [IU]/L (ref 0–44)
AST: 23 [IU]/L (ref 0–40)
Albumin: 3.8 g/dL (ref 3.6–4.6)
Alkaline Phosphatase: 103 [IU]/L (ref 44–121)
BUN/Creatinine Ratio: 15 (ref 10–24)
BUN: 18 mg/dL (ref 10–36)
Bilirubin Total: 0.6 mg/dL (ref 0.0–1.2)
CO2: 24 mmol/L (ref 20–29)
Calcium: 8.9 mg/dL (ref 8.6–10.2)
Chloride: 97 mmol/L (ref 96–106)
Creatinine, Ser: 1.21 mg/dL (ref 0.76–1.27)
Globulin, Total: 2.5 g/dL (ref 1.5–4.5)
Glucose: 126 mg/dL — ABNORMAL HIGH (ref 70–99)
Potassium: 4.8 mmol/L (ref 3.5–5.2)
Sodium: 138 mmol/L (ref 134–144)
Total Protein: 6.3 g/dL (ref 6.0–8.5)
eGFR: 56 mL/min/{1.73_m2} — ABNORMAL LOW (ref >59)

## 2023-12-31 NOTE — Assessment & Plan Note (Signed)
Progressive severe hearing loss with limited benefit from previous hearing aids. Meniere's disease noted. Discussed updated audiology evaluation and ENT consultation to improve hearing and quality of life. Informed consent included risks and benefits of ENT consultation and audiology evaluation. - Place referral for ENT consultation - Order updated audiology evaluation

## 2023-12-31 NOTE — Assessment & Plan Note (Addendum)
Bilateral calf pain for 2-3 days without significant redness or swelling. Differential includes muscle strain from recent fall versus potential blood clots. Low likelihood of bilateral blood clots, but D-dimer test ordered to rule out. Discussed risks and benefits of D-dimer testing and potential need for CT scan if elevated. - Order D-dimer test - Consider CT scan if D-dimer is elevated   Bowel Incontinence Occasional bowel incontinence managed with Miralax as needed. - Continue Miralax as needed  General Health Maintenance Routine blood work needed due to age and upcoming transition to assisted living. Previous blood work in June showed slightly elevated potassium. Discussed benefits of routine blood work to monitor electrolytes, kidney, and liver function. - Order routine blood work including electrolytes, kidney, and liver function tests - Order Quantiferon Gold TB test  FILLED FLT2 FORM FOR POTENTIAL PLACEMENT IN AN ASSISTED LIVING FACILITY Follow-up - Follow up with lung specialist as scheduled - Schedule ENT consultation in [place].

## 2023-12-31 NOTE — Assessment & Plan Note (Signed)
Chronic condition managed with oxygen therapy (2 liters) and nebulizer three times daily. Recent imaging and follow-up with lung specialist. Discussed limited benefit of nebulizer treatment for pulmonary fibrosis and importance of proper nebulizer function. - Continue oxygen therapy - Ensure nebulizer is functioning properly

## 2023-12-31 NOTE — Progress Notes (Signed)
Subjective:  Patient ID: Dalton Flores, male    DOB: [birth year]  Age: 89 y.o. MRN: [id number]  Chief Complaint  Patient presents with   FL2 Form    HPI   Patient lives by himself. His wife passed away in 03-Aug-2023. He is considering to move to assisting living place, so he came today to complete FL2 forms. The patient is a 89 year old male with Meniere's disease and pulmonary fibrosis who presents with concerns about living alone and hearing difficulties. He is accompanied by his daughter, Elita Quick, and her husband, his son-in-law.  He has experienced progressive hearing loss over the years despite using hearing aids from [place]. Meniere's disease contributes to his hearing difficulties. An audiology evaluation was conducted a few months ago, but his hearing continues to decline. Cochlear implants were considered but deemed too invasive given his age.  He has pulmonary fibrosis and is on oxygen therapy, using two liters mainly when active and at night. He uses a nebulizer three times a day, although it has not been functioning properly recently. He was on antibiotics recently for a lung issue. He sees his lung doctor every three months and recently had a scan and an echo. No increase in shortness of breath beyond his usual level.  He is concerned about living alone and wants to stay in his house. However, he acknowledges the need for someone to be with him 24/7 due to safety concerns. He visited [address] assisted living facility recently but is hesitant to stay there permanently. He has experienced falls, including one where he fell backward with a wheelbarrow, and he is fearful of being alone at night. He uses a life alert system but has difficulty hearing the operator. He is generally oriented to time, place, and date. He does not exhibit inappropriate behaviors such as wandering or being verbally abusive. He is independent in personal care activities such as bathing, feeding, and  dressing. He is ambulatory and can walk as long as he can breathe adequately.  He reports calf pain for the past two to three days, with the right leg hurting more than the left. He fell last week, which may have contributed to the pain. No significant swelling or redness observed.  He consumes about three Ensures daily but does not eat much otherwise. His weight is approximately 168-175 pounds, and his height is 5'4". He experiences occasional bowel incontinence, especially when he cannot reach the bathroom in time. He uses incontinence products as a precaution when going out. He has sight limitations due to degeneration but speaks well. He engages in activities like cutting grass and feeding his heater but struggles with group participation due to hearing issues. He requires a Quantiferon Gold test for TB screening as he considers moving to an assisted living facility. His last blood work was in June, and it was mostly normal except for slightly elevated potassium.     11/21/2023   11:27 AM 05/10/2023    8:13 AM 11/09/2022    8:15 AM 11/16/2021    7:36 PM 11/07/2021    8:34 AM  Depression screen PHQ 2/9  Decreased Interest 0 0 0 0 0  Down, Depressed, Hopeless 3 0 0 0 0  PHQ - 2 Score 3 0 0 0 0  Altered sleeping 1      Tired, decreased energy 0      Change in appetite 3      Feeling bad or failure about yourself  0  Trouble concentrating 0      Moving slowly or fidgety/restless 0      Suicidal thoughts 3      PHQ-9 Score 10      Difficult doing work/chores Somewhat difficult            11/21/2023   11:23 AM  Fall Risk   Falls in the past year? 1  Number falls in past yr: 1  Injury with Fall? 0  Risk for fall due to : History of fall(s);Impaired balance/gait;Impaired vision;Impaired mobility  Follow up Education provided    Patient Care Team: Blane Ohara, MD as PCP - General (Family Medicine) Debroah Baller, MD as Consulting Physician (Urology) Cheral Bay,  MD as Referring Physician (Otolaryngology) Marcellus Scott, MD as Referring Physician (Specialist)   Review of Systems  Constitutional:  Negative for chills, fatigue, fever and unexpected weight change.  HENT:  Positive for hearing loss. Negative for congestion, ear pain, sinus pain and sore throat.   Eyes:  Positive for visual disturbance.  Respiratory:  Positive for cough and shortness of breath.   Cardiovascular:  Negative for chest pain and palpitations.  Gastrointestinal:  Negative for abdominal pain, blood in stool, constipation, diarrhea, nausea and vomiting.  Endocrine: Negative for polydipsia.  Genitourinary:  Negative for dysuria.  Musculoskeletal:  Negative for back pain.  Skin:  Negative for rash.  Neurological:  Positive for weakness. Negative for headaches.    Current Outpatient Medications on File Prior to Visit  Medication Sig Dispense Refill   ipratropium-albuterol (DUONEB) 0.5-2.5 (3) MG/3ML SOLN      famotidine (PEPCID) 40 MG tablet Take 40 mg by mouth daily.     No current facility-administered medications on file prior to visit.   Past Medical History:  Diagnosis Date   Emphysema lung (HCC)    Hearing loss    Hypothyroid    Macular degeneration    Meniere disease    Prostate cancer (HCC) 2008   Pulmonary fibrosis [facility])    Past Surgical History:  Procedure Laterality Date   APPENDECTOMY  1946   BACK SURGERY  2005   BLADDER SURGERY  12/16/2019    cystoscopy internal urethrotomy with balloon and holmium and bilateral retrograde pyelograms with dilitation of stricture.    CATARACT EXTRACTION, BILATERAL     CHOLECYSTECTOMY  08/2016   HERNIA REPAIR  1978   LAMINECTOMY     PROSTATECTOMY  2005   TONSILLECTOMY      Family History  Problem Relation Age of Onset   Heart disease Mother    Emphysema Father    Coronary artery disease Father    CVA Father    Brain cancer Sister    Cancer Sister    Diabetes Sister    Diabetes Sister    Parkinson's disease  Brother    Heart attack Brother    COPD Brother    Heart attack Brother    Parkinson's disease Brother    Diabetes Brother    Social History   Socioeconomic History   Marital status: Married    Spouse name: Not on file   Number of children: Not on file   Years of education: Not on file   Highest education level: Not on file  Occupational History   Occupation: retired  Tobacco Use   Smoking status: Never   Smokeless tobacco: Never  Vaping Use   Vaping status: Never Used  Substance and Sexual Activity   Alcohol use: No   Drug use: No  Sexual activity: Not on file  Other Topics Concern   Not on file  Social History Narrative   Not on file   Social Drivers of Health   Financial Resource Strain: Not on file  Food Insecurity: No Food Insecurity (11/09/2022)   Hunger Vital Sign    Worried About Running Out of Food in the Last Year: Never true    Ran Out of Food in the Last Year: Never true  Transportation Needs: No Transportation Needs (11/09/2022)   PRAPARE - Administrator, Civil Service (Medical): No    Lack of Transportation (Non-Medical): No  Physical Activity: Insufficiently Active (11/09/2022)   Exercise Vital Sign    Days of Exercise per Week: 7 days    Minutes of Exercise per Session: 10 min  Stress: No Stress Concern Present (11/09/2022)   [employer] of Occupational Health - Occupational Stress Questionnaire    Feeling of Stress : Not at all  Social Connections: Moderately Integrated (11/09/2022)   Social Connection and Isolation Panel [NHANES]    Frequency of Communication with Friends and Family: More than three times a week    Frequency of Social Gatherings with Friends and Family: More than three times a week    Attends Religious Services: More than 4 times per year    Active Member of [employer] or Organizations: No    Attends Engineer, structural: Never    Marital Status: Married    Objective:  BP 90/60   Pulse 88   Temp (!)  97.1 F (36.2 C)   Resp 18   Ht 5\' 4"  (1.626 m)   Wt 175 lb (79.4 kg)   SpO2 92%   BMI 30.04 kg/m      12/31/2023    9:46 AM 11/21/2023   11:19 AM 11/08/2023    8:47 AM  BP/Weight  Systolic BP 90 95 102  Diastolic BP 60 58 64  Wt. (Lbs) 175 171 171  BMI 30.04 kg/m2 28.46 kg/m2 28.46 kg/m2    Physical Exam Vitals and nursing note reviewed.  Constitutional:      Comments: Very hard of hearing   HENT:     Head: Normocephalic.     Nose:     Comments: 2 L oxygen via nasal cannula Cardiovascular:     Rate and Rhythm: Normal rate and regular rhythm.  Pulmonary:     Comments: Extensive crackles bilaterally Musculoskeletal:        General: Normal range of motion.  Skin:    General: Skin is dry.  Psychiatric:        Mood and Affect: Mood normal.     Diabetic Foot Exam - Simple   No data filed      Lab Results  Component Value Date   WBC 8.6 05/10/2023   HGB 16.4 05/10/2023   HCT 49.1 05/10/2023   PLT 314 05/10/2023   GLUCOSE 93 05/10/2023   CHOL 193 11/09/2022   TRIG 161 (H) 11/09/2022   HDL 41 11/09/2022   LDLCALC 123 (H) 11/09/2022   ALT 10 05/10/2023   AST 17 05/10/2023   NA 141 05/10/2023   K 5.4 (H) 05/10/2023   CL 101 05/10/2023   CREATININE 1.05 05/10/2023   BUN 15 05/10/2023   CO2 24 05/10/2023   TSH 4.010 11/09/2022   HGBA1C 5.3 05/09/2022      Assessment & Plan:    Screening-pulmonary TB -     QuantiFERON-TB Gold Plus -  CBC with Differential/Platelet -     Comprehensive metabolic panel  IPF (idiopathic pulmonary fibrosis) (HCC) Assessment & Plan: Chronic condition managed with oxygen therapy (2 liters) and nebulizer three times daily. Recent imaging and follow-up with lung specialist. Discussed limited benefit of nebulizer treatment for pulmonary fibrosis and importance of proper nebulizer function. - Continue oxygen therapy - Ensure nebulizer is functioning properly  Orders: -     CBC with Differential/Platelet -      Comprehensive metabolic panel  Meniere's disease of both ears -     CBC with Differential/Platelet -     Comprehensive metabolic panel -     Ambulatory referral to ENT  Bilateral hearing loss, unspecified hearing loss type Assessment & Plan: Progressive severe hearing loss with limited benefit from previous hearing aids. Meniere's disease noted. Discussed updated audiology evaluation and ENT consultation to improve hearing and quality of life. Informed consent included risks and benefits of ENT consultation and audiology evaluation. - Place referral for ENT consultation - Order updated audiology evaluation   Orders: -     Ambulatory referral to ENT  Bilateral calf pain Assessment & Plan: Bilateral calf pain for 2-3 days without significant redness or swelling. Differential includes muscle strain from recent fall versus potential blood clots. Low likelihood of bilateral blood clots, but D-dimer test ordered to rule out. Discussed risks and benefits of D-dimer testing and potential need for CT scan if elevated. - Order D-dimer test - Consider CT scan if D-dimer is elevated   Bowel Incontinence Occasional bowel incontinence managed with Miralax as needed. - Continue Miralax as needed  General Health Maintenance Routine blood work needed due to age and upcoming transition to assisted living. Previous blood work in June showed slightly elevated potassium. Discussed benefits of routine blood work to monitor electrolytes, kidney, and liver function. - Order routine blood work including electrolytes, kidney, and liver function tests - Order Quantiferon Gold TB test  FILLED FLT2 FORM FOR POTENTIAL PLACEMENT IN AN ASSISTED LIVING FACILITY Follow-up - Follow up with lung specialist as scheduled - Schedule ENT consultation in [place].  Orders: -     D-dimer, quantitative     No orders of the defined types were placed in this encounter.   Orders Placed This Encounter  Procedures    QuantiFERON-TB Gold Plus   CBC with Differential/Platelet   Comprehensive metabolic panel   D-dimer, quantitative   Ambulatory referral to ENT     Follow-up: No follow-ups on file.    An After Visit Summary was printed and given to the patient.  Windell Moment, MD [facility] [phone]

## 2024-01-01 LAB — D-DIMER, QUANTITATIVE: D-DIMER: 0.45 mg{FEU}/L (ref 0.00–0.49)

## 2024-01-04 LAB — QUANTIFERON-TB GOLD PLUS
QuantiFERON Mitogen Value: >10 [IU]/mL
QuantiFERON Nil Value: 0.02 [IU]/mL
QuantiFERON TB1 Ag Value: 0.03 [IU]/mL
QuantiFERON TB2 Ag Value: 0.05 [IU]/mL
QuantiFERON-TB Gold Plus: NEGATIVE

## 2024-01-07 ENCOUNTER — Telehealth: Payer: Self-pay

## 2024-01-07 ENCOUNTER — Other Ambulatory Visit: Payer: Self-pay

## 2024-01-07 DIAGNOSIS — J84112 Idiopathic pulmonary fibrosis: Secondary | ICD-10-CM

## 2024-01-07 DIAGNOSIS — R053 Chronic cough: Secondary | ICD-10-CM

## 2024-01-07 NOTE — Telephone Encounter (Signed)
Copied from CRM [id number]. Topic: General - Other >> Jan 07, 2024  4:01 PM Phill Myron wrote: Reason for CRM: Daughter Elita Quick is calling and requesting a chest xray due to his chronic cough. Please advise.

## 2024-01-08 DIAGNOSIS — J9611 Chronic respiratory failure with hypoxia: Secondary | ICD-10-CM | POA: Diagnosis not present

## 2024-01-08 DIAGNOSIS — I2721 Secondary pulmonary arterial hypertension: Secondary | ICD-10-CM | POA: Diagnosis not present

## 2024-01-08 DIAGNOSIS — J84112 Idiopathic pulmonary fibrosis: Secondary | ICD-10-CM | POA: Diagnosis not present

## 2024-01-08 DIAGNOSIS — R918 Other nonspecific abnormal finding of lung field: Secondary | ICD-10-CM | POA: Diagnosis not present

## 2024-01-10 DIAGNOSIS — I2721 Secondary pulmonary arterial hypertension: Secondary | ICD-10-CM | POA: Diagnosis not present

## 2024-01-10 DIAGNOSIS — J9611 Chronic respiratory failure with hypoxia: Secondary | ICD-10-CM | POA: Diagnosis not present

## 2024-01-10 DIAGNOSIS — J84112 Idiopathic pulmonary fibrosis: Secondary | ICD-10-CM | POA: Diagnosis not present

## 2024-01-10 DIAGNOSIS — R918 Other nonspecific abnormal finding of lung field: Secondary | ICD-10-CM | POA: Diagnosis not present

## 2024-01-13 ENCOUNTER — Encounter: Payer: Self-pay | Admitting: Family Medicine

## 2024-01-13 DIAGNOSIS — R918 Other nonspecific abnormal finding of lung field: Secondary | ICD-10-CM | POA: Diagnosis not present

## 2024-01-13 DIAGNOSIS — J84112 Idiopathic pulmonary fibrosis: Secondary | ICD-10-CM | POA: Diagnosis not present

## 2024-01-13 DIAGNOSIS — I2721 Secondary pulmonary arterial hypertension: Secondary | ICD-10-CM | POA: Diagnosis not present

## 2024-01-13 DIAGNOSIS — J9611 Chronic respiratory failure with hypoxia: Secondary | ICD-10-CM | POA: Diagnosis not present

## 2024-01-14 DIAGNOSIS — R918 Other nonspecific abnormal finding of lung field: Secondary | ICD-10-CM | POA: Diagnosis not present

## 2024-01-14 DIAGNOSIS — I2721 Secondary pulmonary arterial hypertension: Secondary | ICD-10-CM | POA: Diagnosis not present

## 2024-01-14 DIAGNOSIS — J84112 Idiopathic pulmonary fibrosis: Secondary | ICD-10-CM | POA: Diagnosis not present

## 2024-01-14 DIAGNOSIS — J9611 Chronic respiratory failure with hypoxia: Secondary | ICD-10-CM | POA: Diagnosis not present

## 2024-01-15 DIAGNOSIS — R918 Other nonspecific abnormal finding of lung field: Secondary | ICD-10-CM | POA: Diagnosis not present

## 2024-01-15 DIAGNOSIS — J84112 Idiopathic pulmonary fibrosis: Secondary | ICD-10-CM | POA: Diagnosis not present

## 2024-01-15 DIAGNOSIS — I2721 Secondary pulmonary arterial hypertension: Secondary | ICD-10-CM | POA: Diagnosis not present

## 2024-01-15 DIAGNOSIS — J9611 Chronic respiratory failure with hypoxia: Secondary | ICD-10-CM | POA: Diagnosis not present

## 2024-01-19 DIAGNOSIS — R079 Chest pain, unspecified: Secondary | ICD-10-CM | POA: Diagnosis not present

## 2024-01-19 DIAGNOSIS — R0602 Shortness of breath: Secondary | ICD-10-CM | POA: Diagnosis not present

## 2024-01-19 DIAGNOSIS — J90 Pleural effusion, not elsewhere classified: Secondary | ICD-10-CM | POA: Diagnosis not present

## 2024-01-19 DIAGNOSIS — R0789 Other chest pain: Secondary | ICD-10-CM | POA: Diagnosis not present

## 2024-01-19 DIAGNOSIS — E871 Hypo-osmolality and hyponatremia: Secondary | ICD-10-CM | POA: Diagnosis not present

## 2024-01-19 DIAGNOSIS — E875 Hyperkalemia: Secondary | ICD-10-CM | POA: Diagnosis not present

## 2024-01-19 DIAGNOSIS — N281 Cyst of kidney, acquired: Secondary | ICD-10-CM | POA: Diagnosis not present

## 2024-01-19 DIAGNOSIS — I452 Bifascicular block: Secondary | ICD-10-CM | POA: Diagnosis not present

## 2024-01-19 DIAGNOSIS — R42 Dizziness and giddiness: Secondary | ICD-10-CM | POA: Diagnosis not present

## 2024-01-19 DIAGNOSIS — R918 Other nonspecific abnormal finding of lung field: Secondary | ICD-10-CM | POA: Diagnosis not present

## 2024-01-19 DIAGNOSIS — I2510 Atherosclerotic heart disease of native coronary artery without angina pectoris: Secondary | ICD-10-CM | POA: Diagnosis not present

## 2024-01-19 DIAGNOSIS — Z1152 Encounter for screening for COVID-19: Secondary | ICD-10-CM | POA: Diagnosis not present

## 2024-01-19 DIAGNOSIS — D72829 Elevated white blood cell count, unspecified: Secondary | ICD-10-CM | POA: Diagnosis not present

## 2024-01-19 DIAGNOSIS — M4854XA Collapsed vertebra, not elsewhere classified, thoracic region, initial encounter for fracture: Secondary | ICD-10-CM | POA: Diagnosis not present

## 2024-01-19 DIAGNOSIS — I959 Hypotension, unspecified: Secondary | ICD-10-CM | POA: Diagnosis not present

## 2024-01-19 DIAGNOSIS — R0989 Other specified symptoms and signs involving the circulatory and respiratory systems: Secondary | ICD-10-CM | POA: Diagnosis not present

## 2024-01-19 DIAGNOSIS — Z79899 Other long term (current) drug therapy: Secondary | ICD-10-CM | POA: Diagnosis not present

## 2024-01-20 DIAGNOSIS — E871 Hypo-osmolality and hyponatremia: Secondary | ICD-10-CM | POA: Diagnosis not present

## 2024-01-20 DIAGNOSIS — R0789 Other chest pain: Secondary | ICD-10-CM | POA: Diagnosis not present

## 2024-01-20 DIAGNOSIS — E875 Hyperkalemia: Secondary | ICD-10-CM | POA: Diagnosis not present

## 2024-01-20 DIAGNOSIS — D72829 Elevated white blood cell count, unspecified: Secondary | ICD-10-CM | POA: Diagnosis not present

## 2024-01-22 DIAGNOSIS — R918 Other nonspecific abnormal finding of lung field: Secondary | ICD-10-CM | POA: Diagnosis not present

## 2024-01-22 DIAGNOSIS — J9611 Chronic respiratory failure with hypoxia: Secondary | ICD-10-CM | POA: Diagnosis not present

## 2024-01-22 DIAGNOSIS — J84112 Idiopathic pulmonary fibrosis: Secondary | ICD-10-CM | POA: Diagnosis not present

## 2024-01-22 DIAGNOSIS — I2721 Secondary pulmonary arterial hypertension: Secondary | ICD-10-CM | POA: Diagnosis not present

## 2024-02-10 NOTE — Progress Notes (Signed)
 Subjective:  Patient ID: Dalton Flores, male    DOB: May 12, 1929  Age: 89 y.o. MRN: [id number]  Chief Complaint  Patient presents with   Medical Management of Chronic Issues   Discussed the use of AI scribe software for clinical note transcription with the patient, who gave verbal consent to proceed.  History of Present Illness   The patient, with a history of pulmonary fibrosis and Meniere's disease, presents for a follow-up visit. He reports that his breathing is using two liters of oxygen. He denies any recent falls or pain. The vertigo from Meniere's disease has not been 'too bad' and he denies any current dizziness. He remains active, still able to ride his lawn mower with his oxygen.  Recently, he was treated for pneumonia with multiple infusions of antibiotics and steroids in Dr. Terrilee Croak office due to the unavailability of hospital beds. Despite this, he was taken to the emergency room for chest pain on 01/19/2024. He was kept overnight for observation but was released the next day with no significant findings were identified. The emergency room doctors believed the pneumonia had cleared up. He had a thorough lab work up. His cholesterol and a1c were good. His wbc was elevated but he had been on seroids.   The patient's daughter, who accompanied him to the visit, reported that the patient's breathing and coughing had improved significantly since the pneumonia treatment. He was even able to travel to [place] and attend a baseball game.      Pulmonary fibrosis: Has changed to Dr. Blenda Nicely. Intolerant to ofev (diarrhea).  Patient currently uses 2 L of oxygen at night and with exertion. Not on Esbriet since March. Tolerating pretty well.  Hyperlipidemia: Patient is not taking any medication  Diet: not on diet.   Exercise: Unable to do much exertional.       11/21/2023   11:27 AM 05/10/2023    8:13 AM 11/09/2022    8:15 AM 11/16/2021    7:36 PM 11/07/2021    8:34 AM  Depression  screen PHQ 2/9  Decreased Interest 0 0 0 0 0  Down, Depressed, Hopeless 3 0 0 0 0  PHQ - 2 Score 3 0 0 0 0  Altered sleeping 1      Tired, decreased energy 0      Change in appetite 3      Feeling bad or failure about yourself  0      Trouble concentrating 0      Moving slowly or fidgety/restless 0      Suicidal thoughts 3      PHQ-9 Score 10      Difficult doing work/chores Somewhat difficult            11/21/2023   11:23 AM  Fall Risk   Falls in the past year? 1  Number falls in past yr: 1  Injury with Fall? 0  Risk for fall due to : History of fall(s);Impaired balance/gait;Impaired vision;Impaired mobility  Follow up Education provided    Patient Care Team: Langley Gauss, [place] as PCP - General (Physician Assistant) Debroah Baller, MD as Consulting Physician (Urology) Cheral Bay, MD as Referring Physician (Otolaryngology) Marcellus Scott, MD as Referring Physician (Specialist)   Review of Systems  Constitutional:  Negative for chills, fatigue and fever.  HENT:  Positive for hearing loss. Negative for congestion, ear pain and sore throat.   Respiratory:  Negative for cough and shortness of breath.   Cardiovascular:  Negative for  chest pain.  Gastrointestinal:  Negative for abdominal pain, constipation, diarrhea, nausea and vomiting.  Musculoskeletal:  Negative for arthralgias and myalgias.  Neurological:  Negative for dizziness and headaches.  Psychiatric/Behavioral:  Negative for dysphoric mood.        No dysphoria    Current Outpatient Medications on File Prior to Visit  Medication Sig Dispense Refill   famotidine (PEPCID) 40 MG tablet Take 40 mg by mouth daily.     ipratropium-albuterol (DUONEB) 0.5-2.5 (3) MG/3ML SOLN      No current facility-administered medications on file prior to visit.   Past Medical History:  Diagnosis Date   Emphysema lung (HCC)    Hearing loss    Hypothyroid    Macular degeneration    Meniere disease    Prostate  cancer (HCC) 2008   Pulmonary fibrosis [facility])    Past Surgical History:  Procedure Laterality Date   APPENDECTOMY  1946   BACK SURGERY  2005   BLADDER SURGERY  12/16/2019    cystoscopy internal urethrotomy with balloon and holmium and bilateral retrograde pyelograms with dilitation of stricture.    CATARACT EXTRACTION, BILATERAL     CHOLECYSTECTOMY  08/2016   HERNIA REPAIR  1978   LAMINECTOMY     PROSTATECTOMY  2005   TONSILLECTOMY      Family History  Problem Relation Age of Onset   Heart disease Mother    Emphysema Father    Coronary artery disease Father    CVA Father    Brain cancer Sister    Cancer Sister    Diabetes Sister    Diabetes Sister    Parkinson's disease Brother    Heart attack Brother    COPD Brother    Heart attack Brother    Parkinson's disease Brother    Diabetes Brother    Social History   Socioeconomic History   Marital status: Married    Spouse name: Not on file   Number of children: Not on file   Years of education: Not on file   Highest education level: Not on file  Occupational History   Occupation: retired  Tobacco Use   Smoking status: Never   Smokeless tobacco: Never  Vaping Use   Vaping status: Never Used  Substance and Sexual Activity   Alcohol use: No   Drug use: No   Sexual activity: Not on file  Other Topics Concern   Not on file  Social History Narrative   Not on file   Social Drivers of Health   Financial Resource Strain: Not on file  Food Insecurity: No Food Insecurity (11/09/2022)   Hunger Vital Sign    Worried About Running Out of Food in the Last Year: Never true    Ran Out of Food in the Last Year: Never true  Transportation Needs: No Transportation Needs (11/09/2022)   PRAPARE - Administrator, Civil Service (Medical): No    Lack of Transportation (Non-Medical): No  Physical Activity: Insufficiently Active (11/09/2022)   Exercise Vital Sign    Days of Exercise per Week: 7 days    Minutes of  Exercise per Session: 10 min  Stress: No Stress Concern Present (11/09/2022)   [employer] of Occupational Health - Occupational Stress Questionnaire    Feeling of Stress : Not at all  Social Connections: Moderately Integrated (11/09/2022)   Social Connection and Isolation Panel [NHANES]    Frequency of Communication with Friends and Family: More than three times a week  Frequency of Social Gatherings with Friends and Family: More than three times a week    Attends Religious Services: More than 4 times per year    Active Member of [employer] or Organizations: No    Attends Engineer, structural: Never    Marital Status: Married    Objective:  BP 138/62   Pulse 82   Temp 98.6 F (37 C) (Oral)   Resp 18   Ht 5\' 4"  (1.626 m)   Wt 166 lb 12.8 oz (75.7 kg)   SpO2 95%   BMI 28.63 kg/m      02/11/2024    8:19 AM 12/31/2023    9:46 AM 11/21/2023   11:19 AM  BP/Weight  Systolic BP 138 90 95  Diastolic BP 62 60 58  Wt. (Lbs) 166.8 175 171  BMI 28.63 kg/m2 30.04 kg/m2 28.46 kg/m2    Physical Exam Vitals reviewed.  Constitutional:      Appearance: Normal appearance.  Cardiovascular:     Rate and Rhythm: Normal rate and regular rhythm.     Heart sounds: Normal heart sounds.  Pulmonary:     Effort: Pulmonary effort is normal.     Breath sounds: Rales (bases BL. poor inhalation effort.) present. No wheezing or rhonchi.  Abdominal:     General: Bowel sounds are normal.     Palpations: Abdomen is soft.     Tenderness: There is no abdominal tenderness.  Neurological:     Mental Status: He is alert.  Psychiatric:        Mood and Affect: Mood normal.        Behavior: Behavior normal.     Diabetic Foot Exam - Simple   No data filed      Lab Results  Component Value Date   WBC 12.1 (H) 12/31/2023   HGB 14.7 12/31/2023   HCT 44.8 12/31/2023   PLT 304 12/31/2023   GLUCOSE 126 (H) 12/31/2023   CHOL 193 11/09/2022   TRIG 161 (H) 11/09/2022   HDL 41 11/09/2022    LDLCALC 123 (H) 11/09/2022   ALT 23 12/31/2023   AST 23 12/31/2023   NA 138 12/31/2023   K 4.8 12/31/2023   CL 97 12/31/2023   CREATININE 1.21 12/31/2023   BUN 18 12/31/2023   CO2 24 12/31/2023   TSH 4.010 11/09/2022   HGBA1C 5.3 05/09/2022      Assessment & Plan:   Mixed hyperlipidemia Assessment & Plan: Not at goal, but patient does not wish to take any medicines.  Continue to work on eating a healthy diet and exercise.     Encounter for immunization Best boy Vaccine 47yrs & older  IPF (idiopathic pulmonary fibrosis) (HCC) Assessment & Plan: No treatments.  Management per specialist.  Continue oxygen 2 L daily.    Nocturnal hypoxemia Assessment & Plan: Continue oxygen 2 L    Mixed conductive and sensorineural hearing loss of both ears Assessment & Plan: Significant impairment with maximum capacity hearing aids. Difficulty hearing certain voices. - Transition care to [facility] for better communication.        No orders of the defined types were placed in this encounter.   Orders Placed This Encounter  Procedures   Pfizer Comirnaty Covid-19 Vaccine 20yrs & older     Follow-up: Return in about 3 months (around 05/13/2024) for awv with brady. 6 month follow up with Huston Foley.  Total time spent on today's visit was 30 minutes, including both  face-to-face time and nonface-to-face time personally spent on review of chart (labs and imaging), discussing labs and goals, discussing further work-up, treatment options, referrals to specialist if needed, reviewing outside records of pertinent, answering patient's questions, and coordinating care.   I,Marla I Leal-Borjas,acting as a scribe for Blane Ohara, MD.,have documented all relevant documentation on the behalf of Blane Ohara, MD,as directed by  Blane Ohara, MD while in the presence of Blane Ohara, MD.   An After Visit Summary was printed and given to the patient.  I attest that I have reviewed  this visit and agree with the plan scribed by my staff.   Blane Ohara, MD [facility] [phone]

## 2024-02-11 ENCOUNTER — Encounter: Payer: Self-pay | Admitting: Family Medicine

## 2024-02-11 ENCOUNTER — Ambulatory Visit (INDEPENDENT_AMBULATORY_CARE_PROVIDER_SITE_OTHER): Payer: Medicare HMO | Admitting: Family Medicine

## 2024-02-11 VITALS — BP 138/62 | HR 82 | Temp 98.6°F | Resp 18 | Ht 64.0 in | Wt 166.8 lb

## 2024-02-11 DIAGNOSIS — E782 Mixed hyperlipidemia: Secondary | ICD-10-CM

## 2024-02-11 DIAGNOSIS — G4734 Idiopathic sleep related nonobstructive alveolar hypoventilation: Secondary | ICD-10-CM

## 2024-02-11 DIAGNOSIS — H906 Mixed conductive and sensorineural hearing loss, bilateral: Secondary | ICD-10-CM | POA: Diagnosis not present

## 2024-02-11 DIAGNOSIS — J84112 Idiopathic pulmonary fibrosis: Secondary | ICD-10-CM

## 2024-02-11 DIAGNOSIS — Z23 Encounter for immunization: Secondary | ICD-10-CM

## 2024-02-16 NOTE — Assessment & Plan Note (Signed)
Continue oxygen 2 L. 

## 2024-02-16 NOTE — Assessment & Plan Note (Signed)
 No treatments.  Management per specialist.  Continue oxygen 2 L daily.

## 2024-02-16 NOTE — Assessment & Plan Note (Addendum)
 Not at goal, but patient does not wish to take any medicines.  Continue to work on eating a healthy diet and exercise.

## 2024-02-16 NOTE — Assessment & Plan Note (Signed)
 Significant impairment with maximum capacity hearing aids. Difficulty hearing certain voices. - Transition care to [facility] for better communication.

## 2024-02-18 DIAGNOSIS — J84112 Idiopathic pulmonary fibrosis: Secondary | ICD-10-CM | POA: Diagnosis not present

## 2024-02-22 DIAGNOSIS — J84112 Idiopathic pulmonary fibrosis: Secondary | ICD-10-CM | POA: Diagnosis not present

## 2024-02-24 DIAGNOSIS — H906 Mixed conductive and sensorineural hearing loss, bilateral: Secondary | ICD-10-CM | POA: Diagnosis not present

## 2024-02-24 DIAGNOSIS — H6121 Impacted cerumen, right ear: Secondary | ICD-10-CM | POA: Diagnosis not present

## 2024-02-24 DIAGNOSIS — H6593 Unspecified nonsuppurative otitis media, bilateral: Secondary | ICD-10-CM | POA: Diagnosis not present

## 2024-02-24 DIAGNOSIS — H9313 Tinnitus, bilateral: Secondary | ICD-10-CM | POA: Diagnosis not present

## 2024-02-26 DIAGNOSIS — H353133 Nonexudative age-related macular degeneration, bilateral, advanced atrophic without subfoveal involvement: Secondary | ICD-10-CM | POA: Diagnosis not present

## 2024-03-05 DIAGNOSIS — N451 Epididymitis: Secondary | ICD-10-CM | POA: Diagnosis not present

## 2024-03-05 DIAGNOSIS — C61 Malignant neoplasm of prostate: Secondary | ICD-10-CM | POA: Diagnosis not present

## 2024-03-05 DIAGNOSIS — N3289 Other specified disorders of bladder: Secondary | ICD-10-CM | POA: Diagnosis not present

## 2024-03-12 DIAGNOSIS — R918 Other nonspecific abnormal finding of lung field: Secondary | ICD-10-CM | POA: Diagnosis not present

## 2024-03-12 DIAGNOSIS — J84112 Idiopathic pulmonary fibrosis: Secondary | ICD-10-CM | POA: Diagnosis not present

## 2024-03-12 DIAGNOSIS — J9611 Chronic respiratory failure with hypoxia: Secondary | ICD-10-CM | POA: Diagnosis not present

## 2024-03-12 DIAGNOSIS — I2721 Secondary pulmonary arterial hypertension: Secondary | ICD-10-CM | POA: Diagnosis not present

## 2024-03-16 DIAGNOSIS — H6593 Unspecified nonsuppurative otitis media, bilateral: Secondary | ICD-10-CM | POA: Diagnosis not present

## 2024-03-16 DIAGNOSIS — H9193 Unspecified hearing loss, bilateral: Secondary | ICD-10-CM | POA: Diagnosis not present

## 2024-04-28 DIAGNOSIS — J9611 Chronic respiratory failure with hypoxia: Secondary | ICD-10-CM | POA: Diagnosis not present

## 2024-04-28 DIAGNOSIS — R051 Acute cough: Secondary | ICD-10-CM | POA: Diagnosis not present

## 2024-04-28 DIAGNOSIS — J84112 Idiopathic pulmonary fibrosis: Secondary | ICD-10-CM | POA: Diagnosis not present

## 2024-04-28 DIAGNOSIS — R918 Other nonspecific abnormal finding of lung field: Secondary | ICD-10-CM | POA: Diagnosis not present

## 2024-04-28 DIAGNOSIS — J189 Pneumonia, unspecified organism: Secondary | ICD-10-CM | POA: Diagnosis not present

## 2024-06-04 ENCOUNTER — Ambulatory Visit: Payer: Self-pay

## 2024-06-04 ENCOUNTER — Ambulatory Visit (INDEPENDENT_AMBULATORY_CARE_PROVIDER_SITE_OTHER): Admitting: Physician Assistant

## 2024-06-04 ENCOUNTER — Encounter: Payer: Self-pay | Admitting: Physician Assistant

## 2024-06-04 VITALS — BP 140/80 | HR 83 | Temp 97.2°F | Resp 20 | Ht 64.0 in | Wt 174.0 lb

## 2024-06-04 DIAGNOSIS — S80869A Insect bite (nonvenomous), unspecified lower leg, initial encounter: Secondary | ICD-10-CM | POA: Diagnosis not present

## 2024-06-04 DIAGNOSIS — W57XXXA Bitten or stung by nonvenomous insect and other nonvenomous arthropods, initial encounter: Secondary | ICD-10-CM

## 2024-06-04 MED ORDER — PERMETHRIN 5 % EX CREA: 1.0000 | TOPICAL_CREAM | Freq: Once | CUTANEOUS | 0 refills | Status: AC

## 2024-06-04 MED ORDER — TRIAMCINOLONE ACETONIDE 0.1 % EX CREA: 1.0000 | TOPICAL_CREAM | Freq: Two times a day (BID) | CUTANEOUS | 0 refills | Status: DC

## 2024-06-04 MED ORDER — TRIAMCINOLONE ACETONIDE 40 MG/ML IJ SUSP
80.0000 mg | Freq: Once | INTRAMUSCULAR | Status: AC
Start: 2024-06-04 — End: 2024-06-04
  Administered 2024-06-04: 80 mg via INTRAMUSCULAR

## 2024-06-04 NOTE — Progress Notes (Signed)
 Subjective:  Patient ID: Dalton Flores, male    DOB: 10/29/1929  Age: 89 y.o. MRN: [id number]  Chief Complaint  Patient presents with   Rash    HPI:  Rash on all his body since 2 weeks ago. He could not sleep for the itching.   Discussed the use of AI scribe software for clinical note transcription with the patient, who gave verbal consent to proceed.  History of Present Illness   Dalton Flores is a 89 year old male who presents with widespread itching.  He has been experiencing widespread itching for approximately two weeks, beginning on his feet and legs and spreading to his back and head. The itching is severe, with temporary relief from scratching. Calamine lotion and cortisone cream have provided minimal relief.  He mentions that his stepsister and her family have recently moved into his house and are conducting renovations, which he wonders might be related to his symptoms. He sleeps with his socks on and suspects bed bugs might be the cause, although he has not observed any.  No history of similar itching episodes except for one instance related to medication use in the past. He is not currently on any medications and has no known allergies. His stepsister recently washed his sheets in hot water with Clorox. No fever or signs of infection at the sites of itching.  He has been traveling recently, including trips to [place] , [place] , and [place] . He has a dog that stays outside.          11/21/2023   11:27 AM 05/10/2023    8:13 AM 11/09/2022    8:15 AM 11/16/2021    7:36 PM 11/07/2021    8:34 AM  Depression screen PHQ 2/9  Decreased Interest 0 0 0 0 0  Down, Depressed, Hopeless 3 0 0 0 0  PHQ - 2 Score 3 0 0 0 0  Altered sleeping 1      Tired, decreased energy 0      Change in appetite 3      Feeling bad or failure about yourself  0      Trouble concentrating 0      Moving slowly or fidgety/restless 0      Suicidal thoughts 3      PHQ-9 Score 10       Difficult doing work/chores Somewhat difficult            11/21/2023   11:23 AM  Fall Risk   Falls in the past year? 1  Number falls in past yr: 1  Injury with Fall? 0  Risk for fall due to : History of fall(s);Impaired balance/gait;Impaired vision;Impaired mobility  Follow up Education provided    Patient Care Team: Milon Cleaves, [place] as PCP - General (Physician Assistant) Marda General, MD as Consulting Physician (Urology) Rena Helayne Shellia Ahmani, MD as Referring Physician (Otolaryngology) Chodri, Tanvir, MD as Referring Physician (Specialist)   Review of Systems  Current Outpatient Medications on File Prior to Visit  Medication Sig Dispense Refill   famotidine (PEPCID) 40 MG tablet Take 40 mg by mouth daily.     fluticasone (FLONASE) 50 MCG/ACT nasal spray Place 2 sprays into both nostrils daily.     ipratropium-albuterol  (DUONEB) 0.5-2.5 (3) MG/3ML SOLN      No current facility-administered medications on file prior to visit.   Past Medical History:  Diagnosis Date   Emphysema lung (HCC)    Hearing loss    Hypothyroid    Macular  degeneration    Meniere disease    Prostate cancer (HCC) 2008   Pulmonary fibrosis [facility])    Past Surgical History:  Procedure Laterality Date   APPENDECTOMY  1946   BACK SURGERY  2005   BLADDER SURGERY  12/16/2019    cystoscopy internal urethrotomy with balloon and holmium and bilateral retrograde pyelograms with dilitation of stricture.    CATARACT EXTRACTION, BILATERAL     CHOLECYSTECTOMY  08/2016   HERNIA REPAIR  1978   LAMINECTOMY     PROSTATECTOMY  2005   TONSILLECTOMY      Family History  Problem Relation Age of Onset   Heart disease Mother    Emphysema Father    Coronary artery disease Father    CVA Father    Brain cancer Sister    Cancer Sister    Diabetes Sister    Diabetes Sister    Parkinson's disease Brother    Heart attack Brother    COPD Brother    Heart attack Brother    Parkinson's disease Brother     Diabetes Brother    Social History   Socioeconomic History   Marital status: Married    Spouse name: Not on file   Number of children: Not on file   Years of education: Not on file   Highest education level: Not on file  Occupational History   Occupation: retired  Tobacco Use   Smoking status: Never   Smokeless tobacco: Never  Vaping Use   Vaping status: Never Used  Substance and Sexual Activity   Alcohol use: No   Drug use: No   Sexual activity: Not on file  Other Topics Concern   Not on file  Social History Narrative   Not on file   Social Drivers of Health   Financial Resource Strain: Not on file  Food Insecurity: No Food Insecurity (11/09/2022)   Hunger Vital Sign    Worried About Running Out of Food in the Last Year: Never true    Ran Out of Food in the Last Year: Never true  Transportation Needs: No Transportation Needs (11/09/2022)   PRAPARE - Administrator, Civil Service (Medical): No    Lack of Transportation (Non-Medical): No  Physical Activity: Insufficiently Active (11/09/2022)   Exercise Vital Sign    Days of Exercise per Week: 7 days    Minutes of Exercise per Session: 10 min  Stress: No Stress Concern Present (11/09/2022)   [employer] of Occupational Health - Occupational Stress Questionnaire    Feeling of Stress : Not at all  Social Connections: Moderately Integrated (11/09/2022)   Social Connection and Isolation Panel    Frequency of Communication with Friends and Family: More than three times a week    Frequency of Social Gatherings with Friends and Family: More than three times a week    Attends Religious Services: More than 4 times per year    Active Member of [employer] or Organizations: No    Attends Engineer, structural: Never    Marital Status: Married    Objective:  BP (!) 140/80   Pulse 83   Temp (!) 97.2 F (36.2 C)   Resp 20   Ht 5' 4 (1.626 m)   Wt 174 lb (78.9 kg)   SpO2 92% Comment: 2 L oxygen   BMI  29.87 kg/m      06/04/2024    3:05 PM 02/11/2024    8:19 AM 12/31/2023    9:46 AM  BP/Weight  Systolic BP 140 138 90  Diastolic BP 80 62 60  Wt. (Lbs) 174 166.8 175  BMI 29.87 kg/m2 28.63 kg/m2 30.04 kg/m2    Physical Exam Vitals reviewed.  Constitutional:      Appearance: Normal appearance.  Cardiovascular:     Rate and Rhythm: Normal rate and regular rhythm.     Heart sounds: Normal heart sounds.  Pulmonary:     Effort: Pulmonary effort is normal.     Breath sounds: Normal breath sounds.  Abdominal:     General: Bowel sounds are normal.     Palpations: Abdomen is soft.     Tenderness: There is no abdominal tenderness.  Skin:    General: Skin is dry.     Findings: Rash present.     Comments: Pictures of rash in chart  Neurological:     Mental Status: He is alert and oriented to person, place, and time.  Psychiatric:        Mood and Affect: Mood normal.        Behavior: Behavior normal.      Media Information  Document Information  Photos    06/04/2024 16:01  Attached To:  Office Visit on 06/04/24 with Milon Cleaves, PA  Source Information  Milon Cleaves, [place]  [facility]     Media Information  Document Information  Photos    06/04/2024 16:01  Attached To:  Office Visit on 06/04/24 with Milon Cleaves, PA  Source Information  Milon Cleaves, [place]  [facility]     Media Information  Document Information  Photos    06/04/2024 16:01  Attached To:  Office Visit on 06/04/24 with Milon Cleaves, PA  Source Information  Milon Cleaves, [place]  [facility]       Lab Results  Component Value Date   WBC 12.1 (H) 12/31/2023   HGB 14.7 12/31/2023   HCT 44.8 12/31/2023   PLT 304 12/31/2023   GLUCOSE 126 (H) 12/31/2023   CHOL 193 11/09/2022   TRIG 161 (H) 11/09/2022   HDL 41 11/09/2022   LDLCALC 123 (H) 11/09/2022   ALT 23 12/31/2023   AST 23 12/31/2023   NA 138 12/31/2023   K 4.8 12/31/2023   CL 97 12/31/2023   CREATININE 1.21  12/31/2023   BUN 18 12/31/2023   CO2 24 12/31/2023   TSH 4.010 11/09/2022   HGBA1C 5.3 05/09/2022   Total time spent on today's visit was 30 minutes, including both face-to-face time and nonface-to-face time personally spent on review of chart (labs and imaging), discussing labs and goals, discussing further work-up, treatment options, referrals to specialist if needed, reviewing outside records of pertinent, answering patient's questions, and coordinating care.    Assessment & Plan:  Insect bite of lower leg, unspecified laterality, initial encounter Assessment & Plan: Severe pruritus unresponsive to topical treatments. Dermatology suggests scabies due to lesion patterns and persistent symptoms. Elimite  recommended for treatment. - Administer steroid injection for immediate relief. - Prescribe Elimite  cream. Apply neck down, leave overnight, wash off. - Prescribe triamcinolone  cream for localized pruritus. - Advise washing bedding and clothing in hot water post-treatment. - Monitor for infection or allergic reaction. - Check for bed bugs, consider pest control if needed.  Orders: -     Triamcinolone  Acetonide -     Triamcinolone  Acetonide; Apply 1 Application topically 2 (two) times daily.  Dispense: 30 g; Refill: 0 -     Permethrin ; Apply 1 Application topically once for 1  dose.  Dispense: 60 g; Refill: 0    Assessment and Plan    Pruritus with suspected scabies Severe pruritus unresponsive to topical treatments. Dermatology suggests scabies due to lesion patterns and persistent symptoms. Elimite  recommended for treatment. - Administer steroid injection for immediate relief. - Prescribe Elimite  cream. Apply neck down, leave overnight, wash off. - Prescribe triamcinolone  cream for localized pruritus. - Advise washing bedding and clothing in hot water post-treatment. - Monitor for infection or allergic reaction. - Check for bed bugs, consider pest control if  needed.  Hypertension Blood pressure slightly elevated due to stress from pruritus, not chronic. - Recheck blood pressure before steroid injection.       Meds ordered this encounter  Medications   triamcinolone  acetonide (KENALOG -40) injection 80 mg   triamcinolone  cream (KENALOG ) 0.1 %    Sig: Apply 1 Application topically 2 (two) times daily.    Dispense:  30 g    Refill:  0   permethrin  (ELIMITE ) 5 % cream    Sig: Apply 1 Application topically once for 1 dose.    Dispense:  60 g    Refill:  0    No orders of the defined types were placed in this encounter.    Follow-up: No follow-ups on file.   I,Marla I Leal-Borjas,acting as a scribe for [employer], PA.,have documented all relevant documentation on the behalf of Nola Angles, PA,as directed by  Nola Angles, PA while in the presence of Nola Angles, [place].   An After Visit Summary was printed and given to the patient.  Nola Angles, [place] [facility] [phone]

## 2024-06-04 NOTE — Telephone Encounter (Signed)
 FYI Only or Action Required?: FYI only for provider.  Patient was last seen in primary care on 02/11/2024 by Sherre Clapper, MD. Called Nurse Triage reporting Pruritis. Symptoms began a week ago. Interventions attempted: OTC medications: calmine, cortisone, lotion. Symptoms are: unchanged.  Triage Disposition: See PCP Within 2 Weeks  Patient/caregiver understands and will follow disposition?: Yes  Legs are itching, arms, and top of his head   Reason for Disposition  Itching is a chronic symptom (recurrent or ongoing AND present > 4 weeks)  Answer Assessment - Initial Assessment Questions 1. DESCRIPTION: Describe the itching you are having.     Itching to legs, arms, and top of head 2. SEVERITY: How bad is it?    - MILD: Doesn't interfere with normal activities.   - MODERATE-SEVERE: Interferes with work, school, sleep, or other activities.      Scratches all the time, except for when he is sleeping 3. SCRATCHING: Are there any scratch marks? Bleeding?     Redness, no bleeding 4. ONSET: When did this begin?      1-2 weeks ago 5. CAUSE: What do you think is causing the itching? (ask about swimming pools, pollen, animals, soaps, etc.)     Unknown, he does have red spots that appear flat 6. OTHER SYMPTOMS: Do you have any other symptoms?      Denies They have tried calamine, cortisone creams and lotion. Thinks that the calamine may be helping  Protocols used: Itching - [facility]

## 2024-06-14 DIAGNOSIS — W57XXXA Bitten or stung by nonvenomous insect and other nonvenomous arthropods, initial encounter: Secondary | ICD-10-CM | POA: Insufficient documentation

## 2024-06-14 NOTE — Assessment & Plan Note (Signed)
 Severe pruritus unresponsive to topical treatments. Dermatology suggests scabies due to lesion patterns and persistent symptoms. Elimite  recommended for treatment. - Administer steroid injection for immediate relief. - Prescribe Elimite  cream. Apply neck down, leave overnight, wash off. - Prescribe triamcinolone  cream for localized pruritus. - Advise washing bedding and clothing in hot water post-treatment. - Monitor for infection or allergic reaction. - Check for bed bugs, consider pest control if needed.

## 2024-06-15 DIAGNOSIS — J84112 Idiopathic pulmonary fibrosis: Secondary | ICD-10-CM | POA: Diagnosis not present

## 2024-06-15 DIAGNOSIS — J189 Pneumonia, unspecified organism: Secondary | ICD-10-CM | POA: Diagnosis not present

## 2024-06-15 DIAGNOSIS — J9611 Chronic respiratory failure with hypoxia: Secondary | ICD-10-CM | POA: Diagnosis not present

## 2024-06-15 DIAGNOSIS — I2721 Secondary pulmonary arterial hypertension: Secondary | ICD-10-CM | POA: Diagnosis not present

## 2024-08-14 ENCOUNTER — Ambulatory Visit: Admitting: Physician Assistant

## 2024-08-31 DIAGNOSIS — R918 Other nonspecific abnormal finding of lung field: Secondary | ICD-10-CM | POA: Diagnosis not present

## 2024-08-31 DIAGNOSIS — J84112 Idiopathic pulmonary fibrosis: Secondary | ICD-10-CM | POA: Diagnosis not present

## 2024-08-31 DIAGNOSIS — J189 Pneumonia, unspecified organism: Secondary | ICD-10-CM | POA: Diagnosis not present

## 2024-08-31 DIAGNOSIS — J9611 Chronic respiratory failure with hypoxia: Secondary | ICD-10-CM | POA: Diagnosis not present

## 2024-09-02 ENCOUNTER — Ambulatory Visit (INDEPENDENT_AMBULATORY_CARE_PROVIDER_SITE_OTHER): Admitting: Physician Assistant

## 2024-09-02 VITALS — BP 118/72 | HR 78 | Temp 97.8°F | Ht 64.0 in | Wt 177.0 lb

## 2024-09-02 DIAGNOSIS — K219 Gastro-esophageal reflux disease without esophagitis: Secondary | ICD-10-CM

## 2024-09-02 DIAGNOSIS — E66811 Obesity, class 1: Secondary | ICD-10-CM

## 2024-09-02 DIAGNOSIS — Z23 Encounter for immunization: Secondary | ICD-10-CM

## 2024-09-02 DIAGNOSIS — S80869A Insect bite (nonvenomous), unspecified lower leg, initial encounter: Secondary | ICD-10-CM | POA: Diagnosis not present

## 2024-09-02 DIAGNOSIS — Z6830 Body mass index (BMI) 30.0-30.9, adult: Secondary | ICD-10-CM

## 2024-09-02 DIAGNOSIS — W57XXXA Bitten or stung by nonvenomous insect and other nonvenomous arthropods, initial encounter: Secondary | ICD-10-CM

## 2024-09-02 DIAGNOSIS — E6609 Other obesity due to excess calories: Secondary | ICD-10-CM

## 2024-09-02 DIAGNOSIS — G4734 Idiopathic sleep related nonobstructive alveolar hypoventilation: Secondary | ICD-10-CM | POA: Diagnosis not present

## 2024-09-02 DIAGNOSIS — J84112 Idiopathic pulmonary fibrosis: Secondary | ICD-10-CM | POA: Diagnosis not present

## 2024-09-02 DIAGNOSIS — E782 Mixed hyperlipidemia: Secondary | ICD-10-CM | POA: Diagnosis not present

## 2024-09-02 DIAGNOSIS — H906 Mixed conductive and sensorineural hearing loss, bilateral: Secondary | ICD-10-CM | POA: Diagnosis not present

## 2024-09-02 MED ORDER — FAMOTIDINE 40 MG PO TABS: 40.0000 mg | ORAL_TABLET | Freq: Every day | ORAL | 3 refills | Status: AC

## 2024-09-02 NOTE — Assessment & Plan Note (Addendum)
 Controlled Currently using hearing aids Continue to monitor symptoms Will consider hearing assessment if it continues to get worse

## 2024-09-02 NOTE — Assessment & Plan Note (Addendum)
 Comorbidity include: Hyperlipidemia, Idiopathic pulmonary fibrosis Labs drawn today Will discuss treatment adjustments based on results Orders:   Hemoglobin A1c

## 2024-09-02 NOTE — Assessment & Plan Note (Signed)
 Gastroesophageal reflux symptoms Intermittent heartburn managed with prescription Pepcid. Over-the-counter option considered sufficient. - Prescribe Pepcid 40 mg, 90-day supply, three refills.

## 2024-09-02 NOTE — Progress Notes (Signed)
 Subjective:  Patient ID: Dalton Flores, male    DOB: September 17, 1929  Age: 89 y.o. MRN: [id number]  Chief Complaint  Patient presents with   Medical Management of Chronic Issues    HPI:   Discussed the use of AI scribe software for clinical note transcription with the patient, who gave verbal consent to proceed.  History of Present Illness Dalton Flores is a 89 year old male who presents for a chronic follow-up visit.  He has experienced improvement in itching and rash, previously attributed to bed bugs. After spraying five times and consulting a pest control expert, who recommended heating the room to eliminate eggs, he now only notices a few tiny bugs.  His appetite has improved, and he enjoys eating 'oyster stew without the oysters' and a 'bucket full of crackers'.  His oxygen  therapy is effective, and he does not feel too short of breath. Recent pulmonary function tests were performed, and his oxygen  saturation was reported to be about 92% most of the time.  No belly pain is reported, and he experiences no side effects from his medications, which he takes as needed. He mentions occasional heartburn and previously used a prescription for Pepcid.  No shortness of breath or belly pain. No side effects from his medications.      09/02/2024    9:39 AM 11/21/2023   11:27 AM 05/10/2023    8:13 AM 11/09/2022    8:15 AM 11/16/2021    7:36 PM  Depression screen PHQ 2/9  Decreased Interest 0 0 0 0 0  Down, Depressed, Hopeless 0 3 0 0 0  PHQ - 2 Score 0 3 0 0 0  Altered sleeping  1     Tired, decreased energy  0     Change in appetite  3     Feeling bad or failure about yourself   0     Trouble concentrating  0     Moving slowly or fidgety/restless  0     Suicidal thoughts  3     PHQ-9 Score  10     Difficult doing work/chores  Somewhat difficult           09/02/2024    9:37 AM  Fall Risk   Falls in the past year? 0  Number falls in past yr: 0  Injury with Fall? 0   Risk for fall due to : No Fall Risks  Follow up Falls evaluation completed    Patient Care Team: Milon Cleaves, [place] as PCP - General (Physician Assistant) Marda General, MD as Consulting Physician (Urology) Rena Helayne Shellia Calvert, MD as Referring Physician (Otolaryngology) Chodri, Tanvir, MD as Referring Physician (Specialist)   Review of Systems  Constitutional:  Negative for chills, fatigue and fever.  HENT:  Negative for congestion, ear pain and sore throat.   Respiratory:  Negative for cough and shortness of breath.   Cardiovascular:  Negative for chest pain and palpitations.  Gastrointestinal:  Negative for abdominal pain, constipation, diarrhea, nausea and vomiting.  Genitourinary:  Negative for difficulty urinating and dysuria.  Musculoskeletal:  Negative for arthralgias, back pain and myalgias.  Skin:  Negative for rash.  Neurological:  Negative for dizziness and headaches.  Psychiatric/Behavioral:  Negative for dysphoric mood.     Current Outpatient Medications on File Prior to Visit  Medication Sig Dispense Refill   fluticasone (FLONASE) 50 MCG/ACT nasal spray Place 2 sprays into both nostrils daily.     ipratropium-albuterol  (DUONEB) 0.5-2.5 (3) MG/3ML  SOLN      OXYGEN  Inhale into the lungs continuous.     triamcinolone  cream (KENALOG ) 0.1 % Apply 1 Application topically 2 (two) times daily. 30 g 0   No current facility-administered medications on file prior to visit.   Past Medical History:  Diagnosis Date   Emphysema lung (HCC)    Hearing loss    Hypothyroid    Macular degeneration    Meniere disease    Prostate cancer (HCC) 2008   Pulmonary fibrosis [facility])    Past Surgical History:  Procedure Laterality Date   APPENDECTOMY  1946   BACK SURGERY  2005   BLADDER SURGERY  12/16/2019    cystoscopy internal urethrotomy with balloon and holmium and bilateral retrograde pyelograms with dilitation of stricture.    CATARACT EXTRACTION, BILATERAL      CHOLECYSTECTOMY  08/2016   HERNIA REPAIR  1978   LAMINECTOMY     PROSTATECTOMY  2005   TONSILLECTOMY      Family History  Problem Relation Age of Onset   Heart disease Mother    Emphysema Father    Coronary artery disease Father    CVA Father    Brain cancer Sister    Cancer Sister    Diabetes Sister    Diabetes Sister    Parkinson's disease Brother    Heart attack Brother    COPD Brother    Heart attack Brother    Parkinson's disease Brother    Diabetes Brother    Social History   Socioeconomic History   Marital status: Married    Spouse name: Not on file   Number of children: Not on file   Years of education: Not on file   Highest education level: 8th grade  Occupational History   Occupation: retired  Tobacco Use   Smoking status: Never   Smokeless tobacco: Never  Vaping Use   Vaping status: Never Used  Substance and Sexual Activity   Alcohol use: No   Drug use: No   Sexual activity: Not on file  Other Topics Concern   Not on file  Social History Narrative   Not on file   Social Drivers of Health   Financial Resource Strain: Low Risk  (09/02/2024)   Overall Financial Resource Strain (CARDIA)    Difficulty of Paying Living Expenses: Not hard at all  Food Insecurity: No Food Insecurity (09/02/2024)   Hunger Vital Sign    Worried About Running Out of Food in the Last Year: Never true    Ran Out of Food in the Last Year: Never true  Transportation Needs: No Transportation Needs (09/02/2024)   PRAPARE - Administrator, Civil Service (Medical): No    Lack of Transportation (Non-Medical): No  Physical Activity: Inactive (09/02/2024)   Exercise Vital Sign    Days of Exercise per Week: 0 days    Minutes of Exercise per Session: Not on file  Stress: No Stress Concern Present (09/02/2024)   [employer] of Occupational Health - Occupational Stress Questionnaire    Feeling of Stress: Not at all  Social Connections: Moderately Isolated (09/02/2024)    Social Connection and Isolation Panel    Frequency of Communication with Friends and Family: Never    Frequency of Social Gatherings with Friends and Family: More than three times a week    Attends Religious Services: 1 to 4 times per year    Active Member of [employer] or Organizations: No    Attends Banker  Meetings: Not on file    Marital Status: Widowed    Objective:  BP 118/72 (BP Location: Left Arm, Patient Position: Sitting)   Pulse 78   Temp 97.8 F (36.6 C) (Temporal)   Ht 5' 4 (1.626 m)   Wt 177 lb (80.3 kg)   SpO2 97%   BMI 30.38 kg/m      09/02/2024    9:36 AM 06/04/2024    3:05 PM 02/11/2024    8:19 AM  BP/Weight  Systolic BP 118 140 138  Diastolic BP 72 80 62  Wt. (Lbs) 177 174 166.8  BMI 30.38 kg/m2 29.87 kg/m2 28.63 kg/m2    Physical Exam Vitals reviewed.  Constitutional:      Appearance: Normal appearance.  Cardiovascular:     Rate and Rhythm: Normal rate and regular rhythm.     Heart sounds: Normal heart sounds.  Pulmonary:     Effort: Pulmonary effort is normal.     Breath sounds: Normal breath sounds.  Abdominal:     General: Bowel sounds are normal.     Palpations: Abdomen is soft.     Tenderness: There is no abdominal tenderness.  Neurological:     Mental Status: He is alert and oriented to person, place, and time.  Psychiatric:        Mood and Affect: Mood normal.        Behavior: Behavior normal.       Lab Results  Component Value Date   WBC 12.1 (H) 12/31/2023   HGB 14.7 12/31/2023   HCT 44.8 12/31/2023   PLT 304 12/31/2023   GLUCOSE 126 (H) 12/31/2023   CHOL 193 11/09/2022   TRIG 161 (H) 11/09/2022   HDL 41 11/09/2022   LDLCALC 123 (H) 11/09/2022   ALT 23 12/31/2023   AST 23 12/31/2023   NA 138 12/31/2023   K 4.8 12/31/2023   CL 97 12/31/2023   CREATININE 1.21 12/31/2023   BUN 18 12/31/2023   CO2 24 12/31/2023   TSH 4.010 11/09/2022   HGBA1C 5.3 05/09/2022    Results for orders placed or performed in visit  on 12/31/23  QuantiFERON-TB Gold Plus   Collection Time: 12/31/23 11:10 AM  Result Value Ref Range   QuantiFERON Incubation Incubation performed.    QuantiFERON Criteria Comment    QuantiFERON TB1 Ag Value 0.03 IU/mL   QuantiFERON TB2 Ag Value 0.05 IU/mL   QuantiFERON Nil Value 0.02 IU/mL   QuantiFERON Mitogen Value >10.00 IU/mL   QuantiFERON-TB Gold Plus Negative Negative  .  Assessment & Plan:   Assessment & Plan Mixed hyperlipidemia Not at goal, but patient does not wish to take any medicines.  Continue to work on eating a healthy diet and exercise.  Labs drawn today Will discuss adding medicine based on results Orders:   CBC with Differential/Platelet   Comprehensive metabolic panel with GFR   Lipid panel   IPF (idiopathic pulmonary fibrosis) (HCC) No treatments.  Management per pulmonologist Continue oxygen  2 L daily.  Continue to follow up with pulmonology Recent pulmonary function test is doing well Idiopathic pulmonary fibrosis with chronic hypoxemic respiratory failure Well-managed with oxygen  therapy. Pulmonary function tests showed good results, oxygen  saturation at 92%.     Nocturnal hypoxemia Continue oxygen  2 L  Denies any worsening episodes or needing to change oxygen  Continue to follow up with pulmonology     Mixed conductive and sensorineural hearing loss of both ears Controlled Currently using hearing aids Continue to monitor symptoms Will consider  hearing assessment if it continues to get worse    Encounter for immunization  Orders:   Flu vaccine HIGH DOSE PF(Fluzone Trivalent)  Class 1 obesity due to excess calories with serious comorbidity and body mass index (BMI) of 30.0 to 30.9 in adult Comorbidity include: Hyperlipidemia, Idiopathic pulmonary fibrosis Labs drawn today Will discuss treatment adjustments based on results Orders:   Hemoglobin A1c  Insect bite of lower leg, unspecified laterality, initial encounter Pruritus and rash  due to bed bug bites Improvement noted with effective pest control. Residual spots present. - Consider further intervention if bites persist post-eradication.    Gastroesophageal reflux disease without esophagitis Gastroesophageal reflux symptoms Intermittent heartburn managed with prescription Pepcid. Over-the-counter option considered sufficient. - Prescribe Pepcid 40 mg, 90-day supply, three refills.      Body mass index is 30.38 kg/m.     Meds ordered this encounter  Medications   famotidine (PEPCID) 40 MG tablet    Sig: Take 1 tablet (40 mg total) by mouth daily.    Dispense:  90 tablet    Refill:  3    Orders Placed This Encounter  Procedures   Flu vaccine HIGH DOSE PF(Fluzone Trivalent)   CBC with Differential/Platelet   Comprehensive metabolic panel with GFR   Lipid panel   Hemoglobin A1c       Follow-up: Return in about 4 months (around 01/03/2025) for Chronic, Nola.  An After Visit Summary was printed and given to the patient.    I,Lauren M Auman,acting as a Neurosurgeon for [employer], PA.,have documented all relevant documentation on the behalf of Nola Angles, PA,as directed by  Nola Angles, PA while in the presence of Nola Angles, [place].    Nola Angles, [place] [facility] [phone]

## 2024-09-02 NOTE — Assessment & Plan Note (Signed)
 Pruritus and rash due to bed bug bites Improvement noted with effective pest control. Residual spots present. - Consider further intervention if bites persist post-eradication.

## 2024-09-02 NOTE — Assessment & Plan Note (Addendum)
 Not at goal, but patient does not wish to take any medicines.  Continue to work on eating a healthy diet and exercise.  Labs drawn today Will discuss adding medicine based on results Orders:   CBC with Differential/Platelet   Comprehensive metabolic panel with GFR   Lipid panel

## 2024-09-02 NOTE — Assessment & Plan Note (Addendum)
 No treatments.  Management per pulmonologist Continue oxygen  2 L daily.  Continue to follow up with pulmonology Recent pulmonary function test is doing well Idiopathic pulmonary fibrosis with chronic hypoxemic respiratory failure Well-managed with oxygen  therapy. Pulmonary function tests showed good results, oxygen  saturation at 92%.

## 2024-09-02 NOTE — Assessment & Plan Note (Addendum)
 Continue oxygen  2 L  Denies any worsening episodes or needing to change oxygen  Continue to follow up with pulmonology

## 2024-09-03 ENCOUNTER — Ambulatory Visit: Payer: Self-pay | Admitting: Physician Assistant

## 2024-09-03 LAB — LIPID PANEL
Chol/HDL Ratio: 4.8 ratio (ref 0.0–5.0)
Cholesterol, Total: 209 mg/dL — ABNORMAL HIGH (ref 100–199)
HDL: 44 mg/dL (ref >39)
LDL Chol Calc (NIH): 136 mg/dL — ABNORMAL HIGH (ref 0–99)
Triglycerides: 159 mg/dL — ABNORMAL HIGH (ref 0–149)
VLDL Cholesterol Cal: 29 mg/dL (ref 5–40)

## 2024-09-03 LAB — COMPREHENSIVE METABOLIC PANEL WITH GFR
ALT: 14 IU/L (ref 0–44)
AST: 19 IU/L (ref 0–40)
Albumin: 4.4 g/dL (ref 3.6–4.6)
Alkaline Phosphatase: 105 IU/L (ref 48–129)
BUN/Creatinine Ratio: 20 (ref 10–24)
BUN: 21 mg/dL (ref 10–36)
Bilirubin Total: 0.4 mg/dL (ref 0.0–1.2)
CO2: 27 mmol/L (ref 20–29)
Calcium: 9.6 mg/dL (ref 8.6–10.2)
Chloride: 97 mmol/L (ref 96–106)
Creatinine, Ser: 1.05 mg/dL (ref 0.76–1.27)
Globulin, Total: 2.3 g/dL (ref 1.5–4.5)
Glucose: 106 mg/dL — ABNORMAL HIGH (ref 70–99)
Potassium: 5.2 mmol/L (ref 3.5–5.2)
Sodium: 140 mmol/L (ref 134–144)
Total Protein: 6.7 g/dL (ref 6.0–8.5)
eGFR: 66 mL/min/1.73 (ref >59)

## 2024-09-03 LAB — CBC WITH DIFFERENTIAL/PLATELET
Basophils Absolute: 0.1 x10E3/uL (ref 0.0–0.2)
Basos: 1 %
EOS (ABSOLUTE): 0.4 x10E3/uL (ref 0.0–0.4)
Eos: 3 %
Hematocrit: 48.1 % (ref 37.5–51.0)
Hemoglobin: 16.5 g/dL (ref 13.0–17.7)
Immature Grans (Abs): 0 x10E3/uL (ref 0.0–0.1)
Immature Granulocytes: 0 %
Lymphocytes Absolute: 2.2 x10E3/uL (ref 0.7–3.1)
Lymphs: 19 %
MCH: 31.8 pg (ref 26.6–33.0)
MCHC: 34.3 g/dL (ref 31.5–35.7)
MCV: 93 fL (ref 79–97)
Monocytes Absolute: 1.1 x10E3/uL — ABNORMAL HIGH (ref 0.1–0.9)
Monocytes: 10 %
Neutrophils Absolute: 7.5 x10E3/uL — ABNORMAL HIGH (ref 1.4–7.0)
Neutrophils: 67 %
Platelets: 337 x10E3/uL (ref 150–450)
RBC: 5.19 x10E6/uL (ref 4.14–5.80)
RDW: 13.8 % (ref 11.6–15.4)
WBC: 11.2 x10E3/uL — ABNORMAL HIGH (ref 3.4–10.8)

## 2024-09-03 LAB — HEMOGLOBIN A1C
Est. average glucose Bld gHb Est-mCnc: 105 mg/dL
Hgb A1c MFr Bld: 5.3 % (ref 4.8–5.6)

## 2024-09-07 DIAGNOSIS — J9611 Chronic respiratory failure with hypoxia: Secondary | ICD-10-CM | POA: Diagnosis not present

## 2024-09-07 DIAGNOSIS — J84112 Idiopathic pulmonary fibrosis: Secondary | ICD-10-CM | POA: Diagnosis not present

## 2024-09-08 DIAGNOSIS — N390 Urinary tract infection, site not specified: Secondary | ICD-10-CM | POA: Diagnosis not present

## 2024-09-08 DIAGNOSIS — C61 Malignant neoplasm of prostate: Secondary | ICD-10-CM | POA: Diagnosis not present

## 2024-09-08 DIAGNOSIS — N3289 Other specified disorders of bladder: Secondary | ICD-10-CM | POA: Diagnosis not present

## 2024-11-03 DIAGNOSIS — J9611 Chronic respiratory failure with hypoxia: Secondary | ICD-10-CM | POA: Diagnosis not present

## 2024-11-03 DIAGNOSIS — J84112 Idiopathic pulmonary fibrosis: Secondary | ICD-10-CM | POA: Diagnosis not present

## 2024-11-17 ENCOUNTER — Telehealth: Payer: Self-pay

## 2024-11-17 NOTE — Telephone Encounter (Signed)
 Called patient's daughter back and changed the appointment to tele visit. Ok per [place].

## 2024-11-17 NOTE — Telephone Encounter (Signed)
 Copied from CRM #[id number]. Topic: Appointments - Appointment Cancel/Reschedule >> Nov 17, 2024 10:02 AM Pinkey ORN wrote: Patient/patient representative is calling to cancel or reschedule an appointment. Refer to attachments for appointment information. >> Nov 17, 2024 10:03 AM Pinkey ORN wrote: Pam daughter called on behalf of patient's upcoming appointment on 11/23/24 at 9AM. She is requesting that this appt is done via telephone and not in person, the schedule isn't allowing me to make that change it's giving me January dates to reschedule. Please follow up with Pam [phone], once the change has been completed.

## 2024-11-23 ENCOUNTER — Encounter: Payer: Self-pay | Admitting: Physician Assistant

## 2024-11-23 ENCOUNTER — Ambulatory Visit (INDEPENDENT_AMBULATORY_CARE_PROVIDER_SITE_OTHER): Admitting: Physician Assistant

## 2024-11-23 VITALS — BP 100/74 | HR 84 | Temp 97.8°F

## 2024-11-23 DIAGNOSIS — Z0000 Encounter for general adult medical examination without abnormal findings: Secondary | ICD-10-CM | POA: Diagnosis not present

## 2024-11-23 NOTE — Progress Notes (Unsigned)
 "  Chief Complaint  Patient presents with   Medicare Annual Wellness visit     Subjective:   Dalton Flores is a 89 y.o. male who presents for a Medicare Annual Wellness Visit.  Visit info / Clinical Intake: Medicare Wellness Visit Type:: Subsequent Annual Wellness Visit Persons participating in visit and providing information:: patient & caregiver Medicare Wellness Visit Mode:: Telephone If telephone:: video declined Since this visit was completed virtually, some vitals may be partially provided or unavailable. Missing vitals are due to the limitations of the virtual format.: Documented vitals are patient reported If Telephone or Video please confirm:: I connected with patient using audio/video enable telemedicine. I verified patient identity with two identifiers, discussed telehealth limitations, and patient agreed to proceed. Patient Location:: Home Provider Location:: Clinic Interpreter Needed?: No Pre-visit prep was completed: yes AWV questionnaire completed by patient prior to visit?: no Living arrangements:: with family/others Patient's Overall Health Status Rating: good Typical amount of pain: some Does pain affect daily life?: no Are you currently prescribed opioids?: no  Dietary Habits and Nutritional Risks How many meals a day?: 2 Eats fruit and vegetables daily?: (!) no Most meals are obtained by: having others provide food In the last 2 weeks, have you had any of the following?: none Diabetic:: no  Functional Status Activities of Daily Living (to include ambulation/medication): Independent Ambulation: Independent Medication Administration: Independent Home Management (perform basic housework or laundry): Needs assistance (comment) Manage your own finances?: (!) no Primary transportation is: family / friends Concerns about vision?: no *vision screening is required for WTM* (Patient has Macular degeneration.) Concerns about hearing?: no  Fall  Screening Falls in the past year?: 1 Number of falls in past year: 1 Was there an injury with Fall?: 0 Fall Risk Category Calculator: 2 Patient Fall Risk Level: Moderate Fall Risk  Fall Risk Patient at Risk for Falls Due to: Impaired balance/gait Fall risk Follow up: Falls evaluation completed  Home and Transportation Safety: All rugs have non-skid backing?: yes All stairs or steps have railings?: yes Grab bars in the bathtub or shower?: yes Have non-skid surface in bathtub or shower?: yes Good home lighting?: yes Regular seat belt use?: yes Hospital stays in the last year:: no  Cognitive Assessment Difficulty concentrating, remembering, or making decisions? : no Will 6CIT or Mini Cog be Completed: yes What year is it?: 0 points What month is it?: 0 points Give patient an address phrase to remember (5 components): [address] About what time is it?: 0 points Count backwards from 20 to 1: 2 points Say the months of the year in reverse: 2 points Repeat the address phrase from earlier: 0 points 6 CIT Score: 4 points  Advance Directives (For Healthcare) Does Patient Have a Medical Advance Directive?: No Would patient like information on creating a medical advance directive?: No - Patient declined  Reviewed/Updated  Reviewed/Updated: Reviewed All (Medical, Surgical, Family, Medications, Allergies, Care Teams, Patient Goals)    Allergies (verified) Hydrochlorothiazide, Nitrofurantoin, and Penicillins   Current Medications (verified) Outpatient Encounter Medications as of 11/23/2024  Medication Sig   famotidine  (PEPCID ) 40 MG tablet Take 1 tablet (40 mg total) by mouth daily. (Patient taking differently: Take 40 mg by mouth daily as needed for heartburn.)   fluticasone (FLONASE) 50 MCG/ACT nasal spray Place 2 sprays into both nostrils daily. (Patient taking differently: Place 2 sprays into both nostrils daily as needed for allergies.)   ipratropium-albuterol  (DUONEB)  0.5-2.5 (3) MG/3ML SOLN    OXYGEN   Inhale into the lungs continuous.   [DISCONTINUED] triamcinolone  cream (KENALOG ) 0.1 % Apply 1 Application topically 2 (two) times daily.   No facility-administered encounter medications on file as of 11/23/2024.    History: Past Medical History:  Diagnosis Date   Emphysema lung (HCC)    Hearing loss    Hypothyroid    Macular degeneration    Meniere disease    Prostate cancer (HCC) 2008   Pulmonary fibrosis [facility])    Past Surgical History:  Procedure Laterality Date   APPENDECTOMY  1946   BACK SURGERY  2005   BLADDER SURGERY  12/16/2019    cystoscopy internal urethrotomy with balloon and holmium and bilateral retrograde pyelograms with dilitation of stricture.    CATARACT EXTRACTION, BILATERAL     CHOLECYSTECTOMY  08/2016   HERNIA REPAIR  1978   LAMINECTOMY     PROSTATECTOMY  2005   TONSILLECTOMY     Family History  Problem Relation Age of Onset   Heart disease Mother    Emphysema Father    Coronary artery disease Father    CVA Father    Brain cancer Sister    Cancer Sister    Diabetes Sister    Diabetes Sister    Parkinson's disease Brother    Heart attack Brother    COPD Brother    Heart attack Brother    Parkinson's disease Brother    Diabetes Brother    Social History   Occupational History   Occupation: retired  Tobacco Use   Smoking status: Never   Smokeless tobacco: Never  Vaping Use   Vaping status: Never Used  Substance and Sexual Activity   Alcohol use: No   Drug use: No   Sexual activity: Not on file   Tobacco Counseling Counseling given: Not Answered  SDOH Screenings   Food Insecurity: No Food Insecurity (11/23/2024)  Housing: Unknown (09/02/2024)  Transportation Needs: No Transportation Needs (11/23/2024)  Utilities: Not At Risk (11/23/2024)  Alcohol Screen: Low Risk (11/09/2022)  Depression (PHQ2-9): Low Risk (11/30/2024)  Financial Resource Strain: Low Risk (09/02/2024)  Physical Activity: Inactive  (11/23/2024)  Social Connections: Moderately Isolated (11/23/2024)  Stress: No Stress Concern Present (11/23/2024)  Tobacco Use: Low Risk (11/30/2024)   See flowsheets for full screening details  Depression Screen PHQ 2 & 9 Depression Scale- Over the past 2 weeks, how often have you been bothered by any of the following problems? Little interest or pleasure in doing things: 0 Feeling down, depressed, or hopeless (PHQ Adolescent also includes...irritable): 0 PHQ-2 Total Score: 0     Goals Addressed   None          Objective:    Today's Vitals   11/23/24 0930  BP: 100/74  Pulse: 84  Temp: 97.8 F (36.6 C)  SpO2: 96%   There is no height or weight on file to calculate BMI.  Hearing/Vision screen No results found. Immunizations and Health Maintenance Health Maintenance  Topic Date Due   DTaP/Tdap/Td (1 - Tdap) Never done   Zoster Vaccines- Shingrix (1 of 2) Never done   COVID-19 Vaccine (7 - 2025-26 season) 08/03/2024   Medicare Annual Wellness (AWV)  11/23/2025   Pneumococcal Vaccine: 50+ Years  Completed   Influenza Vaccine  Completed   Meningococcal B Vaccine  Aged Out   Colonoscopy  Discontinued        Assessment/Plan:  This is a routine wellness examination for Gilchrist.  Patient Care Team: Milon Cleaves, [place] as PCP - General (Physician  Assistant) Marda General, MD as Consulting Physician (Urology) Rena Helayne Shellia Langston, MD as Referring Physician (Otolaryngology) Chodri, Tanvir, MD as Referring Physician (Specialist)  I have personally reviewed and noted the following in the patients chart:   Medical and social history Use of alcohol, tobacco or illicit drugs  Current medications and supplements including opioid prescriptions. Functional ability and status Nutritional status Physical activity Advanced directives List of other physicians Hospitalizations, surgeries, and ER visits in previous 12 months Vitals Screenings to include cognitive,  depression, and falls Referrals and appointments  No orders of the defined types were placed in this encounter.  In addition, I have reviewed and discussed with patient certain preventive protocols, quality metrics, and best practice recommendations. A written personalized care plan for preventive services as well as general preventive health recommendations were provided to patient.   Nola Angles, [place]   12/01/2024   Return in 1 year (on 11/23/2025) for awv.  After Visit Summary: (In Person-Declined) Patient declined AVS at this time.   "

## 2024-11-30 ENCOUNTER — Encounter: Payer: Self-pay | Admitting: Physician Assistant

## 2025-01-04 ENCOUNTER — Ambulatory Visit: Admitting: Physician Assistant

## 2025-01-25 ENCOUNTER — Ambulatory Visit: Admitting: Physician Assistant
